# Patient Record
Sex: Female | Born: 1950 | Hispanic: Yes | Marital: Married
Health system: Southern US, Community
[De-identification: ages and names within clinical notes are randomized; demographics above are authoritative.]

## PROBLEM LIST (undated history)

## (undated) DIAGNOSIS — Z9013 Acquired absence of bilateral breasts and nipples: Secondary | ICD-10-CM

## (undated) DIAGNOSIS — F40240 Claustrophobia: Secondary | ICD-10-CM

## (undated) DIAGNOSIS — C50911 Malignant neoplasm of unspecified site of right female breast: Secondary | ICD-10-CM

## (undated) DIAGNOSIS — M1990 Unspecified osteoarthritis, unspecified site: Secondary | ICD-10-CM

## (undated) DIAGNOSIS — F419 Anxiety disorder, unspecified: Secondary | ICD-10-CM

## (undated) DIAGNOSIS — I10 Essential (primary) hypertension: Secondary | ICD-10-CM

## (undated) HISTORY — DX: Essential (primary) hypertension: I10

## (undated) HISTORY — PX: AUGMENTATION MAMMAPLASTY: SUR837

## (undated) HISTORY — DX: Anxiety disorder, unspecified: F41.9

## (undated) HISTORY — DX: Unspecified osteoarthritis, unspecified site: M19.90

---

## 2002-09-27 HISTORY — PX: COLONOSCOPY: SHX174

## 2012-09-27 DIAGNOSIS — C50911 Malignant neoplasm of unspecified site of right female breast: Secondary | ICD-10-CM

## 2012-09-27 HISTORY — DX: Malignant neoplasm of unspecified site of right female breast: C50.911

## 2012-09-27 HISTORY — PX: BILATERAL TOTAL MASTECTOMY WITH AXILLARY LYMPH NODE DISSECTION: SHX6364

## 2012-09-27 HISTORY — PX: MASTECTOMY: SHX3

## 2013-04-11 DIAGNOSIS — C50919 Malignant neoplasm of unspecified site of unspecified female breast: Secondary | ICD-10-CM | POA: Insufficient documentation

## 2015-11-19 ENCOUNTER — Encounter: Payer: Self-pay | Admitting: Hematology and Oncology

## 2015-11-19 ENCOUNTER — Inpatient Hospital Stay: Payer: BLUE CROSS/BLUE SHIELD | Attending: Hematology and Oncology | Admitting: Hematology and Oncology

## 2015-11-19 ENCOUNTER — Inpatient Hospital Stay: Payer: BLUE CROSS/BLUE SHIELD

## 2015-11-19 VITALS — BP 131/86 | HR 72 | Temp 97.8°F | Resp 20 | Wt 235.9 lb

## 2015-11-19 DIAGNOSIS — I10 Essential (primary) hypertension: Secondary | ICD-10-CM | POA: Insufficient documentation

## 2015-11-19 DIAGNOSIS — R978 Other abnormal tumor markers: Secondary | ICD-10-CM | POA: Diagnosis not present

## 2015-11-19 DIAGNOSIS — Z170 Estrogen receptor positive status [ER+]: Secondary | ICD-10-CM | POA: Diagnosis not present

## 2015-11-19 DIAGNOSIS — Z79899 Other long term (current) drug therapy: Secondary | ICD-10-CM | POA: Diagnosis not present

## 2015-11-19 DIAGNOSIS — C50911 Malignant neoplasm of unspecified site of right female breast: Secondary | ICD-10-CM

## 2015-11-19 DIAGNOSIS — Z9013 Acquired absence of bilateral breasts and nipples: Secondary | ICD-10-CM | POA: Diagnosis not present

## 2015-11-19 DIAGNOSIS — R918 Other nonspecific abnormal finding of lung field: Secondary | ICD-10-CM

## 2015-11-19 DIAGNOSIS — M1990 Unspecified osteoarthritis, unspecified site: Secondary | ICD-10-CM | POA: Diagnosis not present

## 2015-11-19 DIAGNOSIS — Z79811 Long term (current) use of aromatase inhibitors: Secondary | ICD-10-CM | POA: Diagnosis not present

## 2015-11-19 LAB — COMPREHENSIVE METABOLIC PANEL
ALT: 21 U/L (ref 14–54)
AST: 24 U/L (ref 15–41)
Albumin: 4.2 g/dL (ref 3.5–5.0)
Alkaline Phosphatase: 121 U/L (ref 38–126)
Anion gap: 4 — ABNORMAL LOW (ref 5–15)
BUN: 19 mg/dL (ref 6–20)
CO2: 25 mmol/L (ref 22–32)
Calcium: 9.3 mg/dL (ref 8.9–10.3)
Chloride: 105 mmol/L (ref 101–111)
Creatinine, Ser: 0.75 mg/dL (ref 0.44–1.00)
GFR calc Af Amer: >60 mL/min (ref >60)
GFR calc non Af Amer: >60 mL/min (ref >60)
Glucose, Bld: 100 mg/dL — ABNORMAL HIGH (ref 65–99)
Potassium: 4 mmol/L (ref 3.5–5.1)
Sodium: 134 mmol/L — ABNORMAL LOW (ref 135–145)
Total Bilirubin: 0.6 mg/dL (ref 0.3–1.2)
Total Protein: 7.4 g/dL (ref 6.5–8.1)

## 2015-11-19 LAB — CBC WITH DIFFERENTIAL/PLATELET
Basophils Absolute: 0.1 10*3/uL (ref 0–0.1)
Basophils Relative: 1 %
Eosinophils Absolute: 0.3 10*3/uL (ref 0–0.7)
Eosinophils Relative: 3 %
HCT: 43.5 % (ref 35.0–47.0)
Hemoglobin: 14.7 g/dL (ref 12.0–16.0)
Lymphocytes Relative: 34 %
Lymphs Abs: 3.1 10*3/uL (ref 1.0–3.6)
MCH: 30.7 pg (ref 26.0–34.0)
MCHC: 33.7 g/dL (ref 32.0–36.0)
MCV: 91 fL (ref 80.0–100.0)
Monocytes Absolute: 0.6 10*3/uL (ref 0.2–0.9)
Monocytes Relative: 7 %
Neutro Abs: 5 10*3/uL (ref 1.4–6.5)
Neutrophils Relative %: 55 %
Platelets: 242 10*3/uL (ref 150–440)
RBC: 4.78 MIL/uL (ref 3.80–5.20)
RDW: 13.5 % (ref 11.5–14.5)
WBC: 9.1 10*3/uL (ref 3.6–11.0)

## 2015-11-19 MED ORDER — EXEMESTANE 25 MG PO TABS: 25.0000 mg | ORAL_TABLET | Freq: Every day | ORAL | Status: DC

## 2015-11-19 NOTE — Progress Notes (Signed)
[facility] day:  11/19/2015  Chief Complaint: [place] is a 65 y.o. female with stage IA right breast cancer who is referred by Dr Kathlene November for new patient assessment.  HPI:   The patient was diagnosed with breast cancer in 01/2013 while living in [place], [place].  She presented with an abnormal mammogram.  Stereotactic core biopsy on 02/06/2013 revealed at least 5 mm invasive ductal carcinoma, basaloid subtype.  Tumor was ER positive (1+), PR positive (1+), and Her2/neu negative.  She underwent bilateral nipple sparing mastectomies and right axillary sentinel lymph node biopsy on 04/11/2013.  Pathology revealed no invasive carcinoma in the right breast.  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  Margins were negative.  Left breast pathology revealed 2 mm DCIS without invasive carcinoma.  Two right sided sentinel lymph nodes were negative.  She underwent immediate reconstruction with expanders followed by additional reconstructive surgery in 09/03/2013 and 12/2013.  She initially received Arimidex following recovery from surgery.  She describes "a lot of joint pain".  After approximately 1 year on Arimidex, she was switched to Aromasin.  She states that she took this without problems for approximately 6 months.  Her last oncologist, Dr. Leory Plowman, stopped the Aromasin given the small size and 1+ hormone receptor positivity on 05/08/2015.  Bone density study on 10/08/2013 was normal with a T score of 1.4 in the lumbar spine, -0.1 in the left hip, and 0.1 in the right hip.  She has a history of pulmonary nodules. Chest CT scans on 10/27/2013 and 08/24/2014 noted scattered sub-62m nodules.  PET scan in 10/29/2014 revealed no evidence of metastatic disease.  The scattered sub-542mnodules were below the resolution limits of the PET scan.  She has had a mildly elevated CA27.29.  CA27.29 was 56 on 12/30/2014.  CA15-3 was 30  (normal) on 12/30/2014.  She notes that her mother had breast cancer in her 3060s The patient underwent BRCA1/2 testing on 02/16/2014 which was negative.  Symptomatically, she feels good.  She has mild vasomotor symptoms.  She denies any breast concerns.  She has issues with a herniated disk.  Past Medical History  Diagnosis Date  . Breast cancer (HCG[place]ison  . Hypertension   . Arthritis   . Anxiety     Past Surgical History  Procedure Laterality Date  . Bilateral total mastectomy with axillary lymph node dissection  2014  . Colonoscopy  2004    Family History  Problem Relation Age of Onset  . Cancer Mother     breast cancer; lung cancer    Social History:  reports that she does not drink alcohol or use illicit drugs. Her tobacco history is not on file.  [place] The patient is alone today.  Allergies: Allergies not on file  Current Medications: Current Outpatient Prescriptions  Medication Sig Dispense Refill  . cyclobenzaprine (FLEXERIL) 10 MG tablet Take 10 mg by mouth 3 (three) times daily as needed for muscle spasms.    . metoprolol succinate (TOPROL-XL) 25 MG 24 hr tablet Take 25 mg by mouth daily.    . Marland Kitchenolpidem (AMBIEN) 10 MG tablet Take 10 mg by mouth at bedtime.    . Marland Kitchenxemestane (AROMASIN) 25 MG tablet Take 1 tablet (25 mg total) by mouth daily after breakfast. 30 tablet 5   No current facility-administered medications for  this visit.    Review of Systems:  GENERAL:  Feels good.  Active.  No fevers, sweats or weight loss. PERFORMANCE STATUS (ECOG):  1 HEENT:  No visual changes, runny nose, sore throat, mouth sores or tenderness. Lungs: No shortness of breath or cough.  No hemoptysis. Cardiac:  No chest pain, palpitations, orthopnea, or PND. GI:  No nausea, vomiting, diarrhea, constipation, melena or hematochezia.  Colonoscopy in 2004. GU:  No urgency, frequency, dysuria, or  hematuria. Musculoskeletal:  Herniated disk.  No joint pain.  No muscle tenderness. Extremities:  Right foot cold and numb (old).  Arthritis in knees.  No swelling. Skin:  No rashes or skin changes. Neuro:  No headache, numbness or weakness, balance or coordination issues. Endocrine:  Hot flashes.  No diabetes, thyroid issues, or night sweats. Psych:  Anxiety.  Panic attacks.  No mood changes or depression. Pain:  Chronic back pain secondary to herniated disk. Review of systems:  All other systems reviewed and found to be negative.  Physical Exam: Blood pressure 131/86, pulse 72, temperature 97.8 F (36.6 C), temperature source Oral, resp. rate 20, weight 235 lb 14.3 oz (107 kg), SpO2 96 %. GENERAL:  Well developed, well nourished, sitting comfortably in the exam room in no acute distress. MENTAL STATUS:  Alert and oriented to person, place and time. HEAD:  Short brown hair.  Normocephalic, atraumatic, face symmetric, no Cushingoid features. EYES:  Brown eyes.  Pupils equal round and reactive to light and accomodation.  No conjunctivitis or scleral icterus. ENT:  Oropharynx clear without lesion.  Tongue normal. Mucous membranes moist.  RESPIRATORY:  Clear to auscultation without rales, wheezes or rhonchi. CARDIOVASCULAR:  Regular rate and rhythm without murmur, rub or gallop. BREAST:  s/p bilateral mastectomies with implants and reconstruction.  No masses, skin changes or nodularity.  ABDOMEN:  Soft, non-tender, with active bowel sounds, and no hepatosplenomegaly.  No masses. SKIN:  No rashes, ulcers or lesions. EXTREMITIES: No edema, no skin discoloration or tenderness.  No palpable cords. LYMPH NODES: No palpable cervical, supraclavicular, axillary or inguinal adenopathy  NEUROLOGICAL: Unremarkable. PSYCH:  Appropriate.  Appointment on 11/19/2015  Component Date Value Ref Range Status  . WBC 11/19/2015 9.1  3.6 - 11.0 K/uL Final  . RBC 11/19/2015 4.78  3.80 - 5.20 MIL/uL Final  .  Hemoglobin 11/19/2015 14.7  12.0 - 16.0 g/dL Final  . HCT 11/19/2015 43.5  35.0 - 47.0 % Final  . MCV 11/19/2015 91.0  80.0 - 100.0 fL Final  . MCH 11/19/2015 30.7  26.0 - 34.0 pg Final  . MCHC 11/19/2015 33.7  32.0 - 36.0 g/dL Final  . RDW 11/19/2015 13.5  11.5 - 14.5 % Final  . Platelets 11/19/2015 242  150 - 440 K/uL Final  . Neutrophils Relative % 11/19/2015 55   Final  . Neutro Abs 11/19/2015 5.0  1.4 - 6.5 K/uL Final  . Lymphocytes Relative 11/19/2015 34   Final  . Lymphs Abs 11/19/2015 3.1  1.0 - 3.6 K/uL Final  . Monocytes Relative 11/19/2015 7   Final  . Monocytes Absolute 11/19/2015 0.6  0.2 - 0.9 K/uL Final  . Eosinophils Relative 11/19/2015 3   Final  . Eosinophils Absolute 11/19/2015 0.3  0 - 0.7 K/uL Final  . Basophils Relative 11/19/2015 1   Final  . Basophils Absolute 11/19/2015 0.1  0 - 0.1 K/uL Final  . Sodium 11/19/2015 134* 135 - 145 mmol/L Final  . Potassium 11/19/2015 4.0  3.5 - 5.1 mmol/L Final  .  Chloride 11/19/2015 105  101 - 111 mmol/L Final  . CO2 11/19/2015 25  22 - 32 mmol/L Final  . Glucose, Bld 11/19/2015 100* 65 - 99 mg/dL Final  . BUN 11/19/2015 19  6 - 20 mg/dL Final  . Creatinine, Ser 11/19/2015 0.75  0.44 - 1.00 mg/dL Final  . Calcium 11/19/2015 9.3  8.9 - 10.3 mg/dL Final  . Total Protein 11/19/2015 7.4  6.5 - 8.1 g/dL Final  . Albumin 11/19/2015 4.2  3.5 - 5.0 g/dL Final  . AST 11/19/2015 24  15 - 41 U/L Final  . ALT 11/19/2015 21  14 - 54 U/L Final  . Alkaline Phosphatase 11/19/2015 121  38 - 126 U/L Final  . Total Bilirubin 11/19/2015 0.6  0.3 - 1.2 mg/dL Final  . GFR calc non Af Amer 11/19/2015 >60  >60 mL/min Final  . GFR calc Af Amer 11/19/2015 >60  >60 mL/min Final   Comment: (NOTE) The eGFR has been calculated using the CKD EPI equation. This calculation has not been validated in all clinical situations. eGFR's persistently <60 mL/min signify possible Chronic Kidney Disease.   . Anion gap 11/19/2015 4* 5 - 15 Final  . CA 27.29  11/19/2015 64.3* 0.0 - 38.6 U/mL Final   Comment: (NOTE) [employer]/ACS methodology Performed At: [facility] [address] [place], [place] [id number] Lindon Romp MD IE:[id number]     Assessment:  [place] is a 65 y.o. female stage IA right breast cancer status post bilateral nipple sparing mastectomies and right axillary sentinel lymph node biopsy on 04/11/2013.    Initial biopsy on 02/06/2013 revealed at least 5 mm invasive ductal carcinoma, basaloid subtype.  Pathology post mastectomy revealed no invasive carcinoma in the right breast.  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  Margins were negative.  Two right sided sentinel lymph nodes were negative.  Left breast pathology revealed 2 mm DCIS without invasive carcinoma.  Tumor was ER positive (1+), PR positive (1+), and Her2/neu negative.  She underwent immediate reconstruction with expanders followed by additional reconstructive surgery in 09/03/2013 and 12/2013.  Her mother had breast cancer in her 33s.  BRCA1/2 testing was negative on 02/16/2014.  She initially received Arimidex following recovery from surgery.  She describes "a lot of joint pain".  After approximately 1 year on Arimidex, she was switched to Aromasin for approximately 6 months.  Aromasin was discontinued on 05/08/2015 given the small size and 1+ hormone receptor positivity.  She denies any symptoms while on Aromasin.  CA27.29 was 56 (elevated) on 12/30/2014.  CA15-3 was 30 (normal) on 12/30/2014.  Bone density study on 10/08/2013 was normal with a T score of 1.4 in the lumbar spine, -0.1 in the left hip, and 0.1 in the right hip.  She has a history of pulmonary nodules. Chest CT scans on 10/27/2013 and 08/24/2014 noted scattered sub-69m nodules.  PET scan in 10/29/2014 revealed no evidence of metastatic disease.  The sub-587mnodules were below the resolution limits of the PET scan.  Symptomatically, she notes mild vasomotor  symptoms.  She has issues with a herniated disk.  Exam is unremarkable.  Plan: 1. Review entire medical history, diagnosis and management of breast cancer, and plan for follow-up.  Discuss plan for PET scan if tumor marker elevated given history of pulmonary nodules.  Discuss reinstitution of aromatase inhibitor (Aromasin).  Discuss plan for bone density study every 2 years. 2. Discuss need for colonoscopy every 10 years (late).  3. Labs today:  CBC with diff, CMP, CA27.29. 4. Rx:  Aromasin 25 mg po q day.  Dis: #30 with 5 refills. 5. RTC in 3 months for labs (CMP). 6. RTC in 6 months for MD assess, labs (CBC with diff, CMP, CA27.29).  Addendum:  Patient notified of elevated CA27.29.  Will schedule follow-up PET scan.  Review of records noted bone density study in 2015 rather than 2016.  Bone density study will be ordered.   Lequita Asal, MD  11/19/2015, 2:36 PM

## 2015-11-19 NOTE — Progress Notes (Signed)
Pt moved from [facility]. Here for continuation of care for R Breast Cancer. Pt had double mastectomy  in 2014. She was treated with AI's. Stopped taking AI d/t severe joint discomfort. Pt feels well. Hx chronic back pain  d/t herniate disc. Hx of panic attacks/anxiety. Pt is to restart aromasin. Prescription was faxed to [facility] in [place].

## 2015-11-20 ENCOUNTER — Other Ambulatory Visit: Payer: Self-pay | Admitting: Hematology and Oncology

## 2015-11-20 ENCOUNTER — Telehealth: Payer: Self-pay

## 2015-11-20 ENCOUNTER — Telehealth: Payer: Self-pay | Admitting: Hematology and Oncology

## 2015-11-20 DIAGNOSIS — C50911 Malignant neoplasm of unspecified site of right female breast: Secondary | ICD-10-CM

## 2015-11-20 LAB — CANCER ANTIGEN 27.29: CA 27.29: 64.3 U/mL — ABNORMAL HIGH (ref 0.0–38.6)

## 2015-11-20 NOTE — Telephone Encounter (Signed)
Patient got a call informing her that Dr. Loletha Grayer wants a PET scan and she just wanted to let you know that she is claustrophobic and that in [place] her MD would prescribe Xanax for any procedures that would have her in an enclosed space. She also said she can't do it between April 1-4 because she will be out of town.

## 2015-11-20 NOTE — Telephone Encounter (Signed)
Called pt per MD request to notify of a elevated Ca 27.29.  Md wanted to know if pt would like a PET and pt agreed.  Informed pt that I will have scheduling call and set up appt. Pt verbalized an understanding and in agreement with plan.

## 2015-11-23 ENCOUNTER — Encounter: Payer: Self-pay | Admitting: Hematology and Oncology

## 2015-11-27 ENCOUNTER — Telehealth: Payer: Self-pay | Admitting: Hematology and Oncology

## 2015-11-27 ENCOUNTER — Other Ambulatory Visit: Payer: Self-pay | Admitting: Hematology and Oncology

## 2015-11-27 NOTE — Telephone Encounter (Signed)
   Ref Range 8d ago    CA 27.29 0.0 - 38.6 U/mL 64.3 (H)

## 2015-11-27 NOTE — Telephone Encounter (Signed)
Patient called to inquire about PET. Scheduling notes from last visit do not indicate one should be scheduled though an order is in. Notes indicate PET will be scheduled depending on tumor marker elevation. Please advise [place] if this should be scheduled or not. Thanks.

## 2015-11-27 NOTE — Telephone Encounter (Signed)
Patient is aware of her PET scheduled for Tuesday, March 7 at [facility] and wants to make sure Dr. Mike Gip will be sending a prescription for her Xanax due to her claustrophobia. Thanks.

## 2015-11-27 NOTE — Telephone Encounter (Signed)
  Order for PET scan has been in.  Is she scheduled for the scan?  M

## 2015-11-27 NOTE — Telephone Encounter (Signed)
[place],   This was a separate in box sent it was added on because of a elevated Ca. 27.29.  She needs a PET scheduled.  If im not mistaken order was placed by Dr. Loletha Grayer and I will check to be sure   Thank you.  [name]

## 2015-12-01 ENCOUNTER — Telehealth: Payer: Self-pay | Admitting: Hematology and Oncology

## 2015-12-01 ENCOUNTER — Other Ambulatory Visit: Payer: Self-pay | Admitting: Hematology and Oncology

## 2015-12-01 DIAGNOSIS — C50911 Malignant neoplasm of unspecified site of right female breast: Secondary | ICD-10-CM

## 2015-12-01 MED ORDER — ALPRAZOLAM 0.25 MG PO TABS: ORAL_TABLET | ORAL | Status: DC

## 2015-12-01 NOTE — Telephone Encounter (Signed)
Patient checked with her pharmacy and said the Xanax Rx is still not in. She said she won't be able to do the PET without it and won't even go to the appointment tomorrow for the PET without the Rx because she had such a negative experience with her claustrophobia the last time she had one done. Please send in Rx asap to her pharmacy. Thank you.

## 2015-12-01 NOTE — Telephone Encounter (Signed)
Xanax 0.25 mg #1 called in. Pt notified

## 2015-12-02 ENCOUNTER — Ambulatory Visit: Admission: RE | Admit: 2015-12-02 | Payer: BLUE CROSS/BLUE SHIELD | Source: Ambulatory Visit

## 2015-12-16 ENCOUNTER — Telehealth: Payer: Self-pay | Admitting: Hematology and Oncology

## 2015-12-16 NOTE — Telephone Encounter (Signed)
She is panicked about her CT and bone scan on 3/24 because she learned that they will "wrap" her arm or strap her arm to table for bone scan. She wants to make sure she will have Xanax for both procedures or she will be too nervous and will not even be able to lay on the table. She is extremely claustrobphobic. She is afraid that the medicine will wear off from the first one before the second test. Please prescribe and let patient know asap when Rx has been called in. Routing this to [place] since she is on call today and Mike Gip is out all week.

## 2015-12-18 ENCOUNTER — Telehealth: Payer: Self-pay | Admitting: Hematology and Oncology

## 2015-12-18 NOTE — Telephone Encounter (Signed)
Patient called again today to find out if her Rx for claustrophobia for procedures tomorrow has been called in. Should be two doses for both exams. Can you please confirm? Thanks.

## 2015-12-19 ENCOUNTER — Ambulatory Visit: Payer: BLUE CROSS/BLUE SHIELD

## 2015-12-19 ENCOUNTER — Encounter
Admission: RE | Admit: 2015-12-19 | Discharge: 2015-12-19 | Disposition: A | Discharge status: Home / Self Care | Payer: BLUE CROSS/BLUE SHIELD | Source: Ambulatory Visit | Attending: Hematology and Oncology | Admitting: Hematology and Oncology

## 2015-12-19 ENCOUNTER — Ambulatory Visit
Admission: RE | Admit: 2015-12-19 | Discharge: 2015-12-19 | Disposition: A | Discharge status: Home / Self Care | Payer: BLUE CROSS/BLUE SHIELD | Source: Ambulatory Visit | Attending: Hematology and Oncology | Admitting: Hematology and Oncology

## 2015-12-19 DIAGNOSIS — K5730 Diverticulosis of large intestine without perforation or abscess without bleeding: Secondary | ICD-10-CM | POA: Insufficient documentation

## 2015-12-19 DIAGNOSIS — Z9013 Acquired absence of bilateral breasts and nipples: Secondary | ICD-10-CM | POA: Insufficient documentation

## 2015-12-19 DIAGNOSIS — K760 Fatty (change of) liver, not elsewhere classified: Secondary | ICD-10-CM | POA: Diagnosis not present

## 2015-12-19 DIAGNOSIS — C50911 Malignant neoplasm of unspecified site of right female breast: Secondary | ICD-10-CM

## 2015-12-19 DIAGNOSIS — K429 Umbilical hernia without obstruction or gangrene: Secondary | ICD-10-CM | POA: Diagnosis not present

## 2015-12-19 DIAGNOSIS — R918 Other nonspecific abnormal finding of lung field: Secondary | ICD-10-CM | POA: Insufficient documentation

## 2015-12-19 HISTORY — DX: Malignant neoplasm of unspecified site of right female breast: C50.911

## 2015-12-19 MED ORDER — IOPAMIDOL (ISOVUE-300) INJECTION 61%
100.0000 mL | Freq: Once | INTRAVENOUS | Status: AC | PRN
  Administered 2015-12-19: 100 mL via INTRAVENOUS

## 2015-12-19 MED ORDER — TECHNETIUM TC 99M MEDRONATE IV KIT
25.0000 | PACK | Freq: Once | INTRAVENOUS | Status: AC | PRN
  Administered 2015-12-19: 22.64 via INTRAVENOUS

## 2015-12-19 NOTE — Telephone Encounter (Signed)
Mickel Baas,   Per NP Georgeanne Nim she stated she had spoke with you about this on 3/23 in [place] regarding the medication (Xanax)  for bone scan and she was not in agreement for prescribing the medication at this time for a bone scan.

## 2015-12-19 NOTE — Telephone Encounter (Signed)
Not it was not made clear to me that [place] did not agree about the Rx for bone scan. I recall her saying that [name] would be handling it from [place] but I was not aware that she disagreed about prescribing the second dose.

## 2015-12-22 ENCOUNTER — Other Ambulatory Visit: Payer: Self-pay | Admitting: Hematology and Oncology

## 2015-12-22 DIAGNOSIS — R948 Abnormal results of function studies of other organs and systems: Secondary | ICD-10-CM

## 2015-12-22 DIAGNOSIS — R937 Abnormal findings on diagnostic imaging of other parts of musculoskeletal system: Secondary | ICD-10-CM

## 2015-12-24 ENCOUNTER — Telehealth: Payer: Self-pay | Admitting: Hematology and Oncology

## 2015-12-24 NOTE — Telephone Encounter (Signed)
Called pt and went over bone scan.  Told her that it did not show metastatic disease but did show something in C3-4 in the neck and rec: that she get cervical xray.  The ct scan showed some small pulmonary nodules so tiny they were unable to determine the significance and rec: another scan in 3 months.  I told her that I would show the reports to md in am and get back with her on next plan from there and she is agreeable to this

## 2015-12-24 NOTE — Telephone Encounter (Signed)
Patient asks if you would please call her when you get the results of her scans. Thank you!

## 2016-01-01 ENCOUNTER — Ambulatory Visit: Payer: BLUE CROSS/BLUE SHIELD

## 2016-01-05 ENCOUNTER — Ambulatory Visit: Payer: BLUE CROSS/BLUE SHIELD

## 2016-01-08 ENCOUNTER — Ambulatory Visit: Payer: BLUE CROSS/BLUE SHIELD

## 2016-01-28 ENCOUNTER — Other Ambulatory Visit: Payer: Self-pay | Admitting: Internal Medicine

## 2016-02-10 ENCOUNTER — Ambulatory Visit
Admission: RE | Admit: 2016-02-10 | Discharge: 2016-02-10 | Disposition: A | Discharge status: Home / Self Care | Payer: BLUE CROSS/BLUE SHIELD | Source: Ambulatory Visit | Attending: Hematology and Oncology | Admitting: Hematology and Oncology

## 2016-02-10 DIAGNOSIS — Z79899 Other long term (current) drug therapy: Secondary | ICD-10-CM | POA: Insufficient documentation

## 2016-02-10 DIAGNOSIS — C50911 Malignant neoplasm of unspecified site of right female breast: Secondary | ICD-10-CM | POA: Insufficient documentation

## 2016-02-10 DIAGNOSIS — M8580 Other specified disorders of bone density and structure, unspecified site: Secondary | ICD-10-CM | POA: Insufficient documentation

## 2016-02-10 DIAGNOSIS — M85851 Other specified disorders of bone density and structure, right thigh: Secondary | ICD-10-CM | POA: Diagnosis not present

## 2016-02-18 ENCOUNTER — Inpatient Hospital Stay: Payer: BLUE CROSS/BLUE SHIELD

## 2016-02-19 ENCOUNTER — Other Ambulatory Visit: Payer: BLUE CROSS/BLUE SHIELD

## 2016-02-26 ENCOUNTER — Other Ambulatory Visit: Payer: Self-pay | Admitting: Hematology and Oncology

## 2016-02-27 ENCOUNTER — Inpatient Hospital Stay: Payer: BLUE CROSS/BLUE SHIELD

## 2016-03-03 ENCOUNTER — Encounter: Payer: Self-pay | Admitting: *Deleted

## 2016-03-10 ENCOUNTER — Telehealth: Payer: Self-pay | Admitting: *Deleted

## 2016-03-10 NOTE — Telephone Encounter (Signed)
-----   Message from Lequita Asal, MD sent at 03/09/2016  4:30 PM EDT ----- Regarding: Please call patient  Lung nodules are stable and were compared with nodules noted on outside CT scan from 2015.  M   ----- Message -----    From: Rad Results In Interface    Sent: 03/09/2016   3:52 PM      To: Lequita Asal, MD

## 2016-03-10 NOTE — Telephone Encounter (Signed)
Called and left message that dr Mike Gip wanted to be on safe side and compare her old scans from [facility] with scans we have and it is stable lung nodules not changed since 2015 scan. It was good news to chare with pt.  If she has questions she can call.

## 2016-03-18 ENCOUNTER — Inpatient Hospital Stay: Payer: BLUE CROSS/BLUE SHIELD | Attending: Hematology and Oncology

## 2016-03-18 DIAGNOSIS — Z170 Estrogen receptor positive status [ER+]: Secondary | ICD-10-CM | POA: Diagnosis not present

## 2016-03-18 DIAGNOSIS — C50911 Malignant neoplasm of unspecified site of right female breast: Secondary | ICD-10-CM | POA: Diagnosis not present

## 2016-03-18 DIAGNOSIS — Z9011 Acquired absence of right breast and nipple: Secondary | ICD-10-CM | POA: Insufficient documentation

## 2016-03-18 LAB — COMPREHENSIVE METABOLIC PANEL
ALT: 27 U/L (ref 14–54)
AST: 24 U/L (ref 15–41)
Albumin: 4.2 g/dL (ref 3.5–5.0)
Alkaline Phosphatase: 111 U/L (ref 38–126)
Anion gap: 8 (ref 5–15)
BUN: 19 mg/dL (ref 6–20)
CO2: 26 mmol/L (ref 22–32)
Calcium: 9.4 mg/dL (ref 8.9–10.3)
Chloride: 104 mmol/L (ref 101–111)
Creatinine, Ser: 0.85 mg/dL (ref 0.44–1.00)
GFR calc Af Amer: >60 mL/min (ref >60)
GFR calc non Af Amer: >60 mL/min (ref >60)
Glucose, Bld: 99 mg/dL (ref 65–99)
Potassium: 4.1 mmol/L (ref 3.5–5.1)
Sodium: 138 mmol/L (ref 135–145)
Total Bilirubin: 0.7 mg/dL (ref 0.3–1.2)
Total Protein: 7.5 g/dL (ref 6.5–8.1)

## 2016-05-19 ENCOUNTER — Inpatient Hospital Stay: Payer: BLUE CROSS/BLUE SHIELD | Attending: Hematology and Oncology

## 2016-05-19 ENCOUNTER — Encounter: Payer: Self-pay | Admitting: Hematology and Oncology

## 2016-05-19 ENCOUNTER — Inpatient Hospital Stay (HOSPITAL_BASED_OUTPATIENT_CLINIC_OR_DEPARTMENT_OTHER): Payer: BLUE CROSS/BLUE SHIELD | Admitting: Hematology and Oncology

## 2016-05-19 VITALS — BP 114/74 | HR 73 | Temp 98.6°F | Resp 18 | Wt 233.4 lb

## 2016-05-19 DIAGNOSIS — Z79899 Other long term (current) drug therapy: Secondary | ICD-10-CM | POA: Insufficient documentation

## 2016-05-19 DIAGNOSIS — R918 Other nonspecific abnormal finding of lung field: Secondary | ICD-10-CM

## 2016-05-19 DIAGNOSIS — R0602 Shortness of breath: Secondary | ICD-10-CM

## 2016-05-19 DIAGNOSIS — F419 Anxiety disorder, unspecified: Secondary | ICD-10-CM | POA: Insufficient documentation

## 2016-05-19 DIAGNOSIS — Z170 Estrogen receptor positive status [ER+]: Secondary | ICD-10-CM

## 2016-05-19 DIAGNOSIS — I10 Essential (primary) hypertension: Secondary | ICD-10-CM | POA: Diagnosis not present

## 2016-05-19 DIAGNOSIS — C50911 Malignant neoplasm of unspecified site of right female breast: Secondary | ICD-10-CM

## 2016-05-19 DIAGNOSIS — M1990 Unspecified osteoarthritis, unspecified site: Secondary | ICD-10-CM | POA: Insufficient documentation

## 2016-05-19 DIAGNOSIS — Z9013 Acquired absence of bilateral breasts and nipples: Secondary | ICD-10-CM

## 2016-05-19 DIAGNOSIS — R948 Abnormal results of function studies of other organs and systems: Secondary | ICD-10-CM

## 2016-05-19 DIAGNOSIS — R937 Abnormal findings on diagnostic imaging of other parts of musculoskeletal system: Secondary | ICD-10-CM

## 2016-05-19 DIAGNOSIS — M8580 Other specified disorders of bone density and structure, unspecified site: Secondary | ICD-10-CM | POA: Insufficient documentation

## 2016-05-19 LAB — CBC WITH DIFFERENTIAL/PLATELET
Basophils Absolute: 0.1 10*3/uL (ref 0–0.1)
Basophils Relative: 1 %
Eosinophils Absolute: 0.4 10*3/uL (ref 0–0.7)
Eosinophils Relative: 4 %
HCT: 42.4 % (ref 35.0–47.0)
Hemoglobin: 14.4 g/dL (ref 12.0–16.0)
Lymphocytes Relative: 37 %
Lymphs Abs: 3.5 10*3/uL (ref 1.0–3.6)
MCH: 31.1 pg (ref 26.0–34.0)
MCHC: 33.8 g/dL (ref 32.0–36.0)
MCV: 92.1 fL (ref 80.0–100.0)
Monocytes Absolute: 0.7 10*3/uL (ref 0.2–0.9)
Monocytes Relative: 7 %
Neutro Abs: 4.8 10*3/uL (ref 1.4–6.5)
Neutrophils Relative %: 51 %
Platelets: 254 10*3/uL (ref 150–440)
RBC: 4.61 MIL/uL (ref 3.80–5.20)
RDW: 13.4 % (ref 11.5–14.5)
WBC: 9.5 10*3/uL (ref 3.6–11.0)

## 2016-05-19 LAB — COMPREHENSIVE METABOLIC PANEL
ALT: 24 U/L (ref 14–54)
AST: 24 U/L (ref 15–41)
Albumin: 4.4 g/dL (ref 3.5–5.0)
Alkaline Phosphatase: 92 U/L (ref 38–126)
Anion gap: 7 (ref 5–15)
BUN: 21 mg/dL — ABNORMAL HIGH (ref 6–20)
CO2: 27 mmol/L (ref 22–32)
Calcium: 9.6 mg/dL (ref 8.9–10.3)
Chloride: 105 mmol/L (ref 101–111)
Creatinine, Ser: 0.86 mg/dL (ref 0.44–1.00)
GFR calc Af Amer: >60 mL/min (ref >60)
GFR calc non Af Amer: >60 mL/min (ref >60)
Glucose, Bld: 102 mg/dL — ABNORMAL HIGH (ref 65–99)
Potassium: 4.5 mmol/L (ref 3.5–5.1)
Sodium: 139 mmol/L (ref 135–145)
Total Bilirubin: 0.7 mg/dL (ref 0.3–1.2)
Total Protein: 7.7 g/dL (ref 6.5–8.1)

## 2016-05-19 MED ORDER — EXEMESTANE 25 MG PO TABS: 25.0000 mg | ORAL_TABLET | Freq: Every day | ORAL | 3 refills | Status: DC

## 2016-05-19 NOTE — Progress Notes (Signed)
[facility] day:  05/19/16   Chief Complaint: Brittany Ramos is a 65 y.o. female with stage IA right breast cancer who is seen for review of multiple studies and 6 month assessment.  HPI:   The patient was last seen in the medical oncology clinic on 11/19/2015.  At that time, she was seen for initial assessment.  She had undergone bilateral mastectomy in 03/2013 while living in [place].  She has only received 1 year of Arimidex and 6 months of Aromasin secondary to arthalgias, small tumor size, and 1+ hormone receptor positivity per her prior oncologist. She had a history of an elevated CA27.29 in 12/2014.   At last visit, she noted mild vasomotor symptoms.  She had issues with a herniated disk.  Exam was unremarkable.  She was given a prescription from Aromasin.  CA27.29 was 64.3 (0-38.6).  She was contacted regarding her elevated tumor marker.  A PET scan was ordered, but denied.  She underwent chest, abdomen, and pelvic CT scan on 12/19/2015.  CT scan revealed 7 indeterminate scattered pulmonary nodules in both lungs, largest 7 mm in the right upper lobe  There were no additional potential sites of metastatic disease in the chest, abdomen or pelvis. There was no lymphadenopathy or osseous lesions.  There was patchy basilar predominant subpleural reticulation throughout both lungs, which could indicate an interstitial lung disease such as nonspecific interstitial pneumonia (NSIP).  Additional findings included moderate fat containing periumbilical hernia, suggestion of diffuse hepatic steatosis and mild sigmoid diverticulosis.  Following her imaging, outside chest CT scan from 08/24/2014 was reviewed by radiology.  Each of the 7 pulmonary nodules described on the original 12/19/2015 CT report were stable since 08/24/2014, indicating 15 month stability. These nodules are considered benign.   Bone scan on 12/19/2015 revealed no abnormal uptake in the lumbar  spine.  There was mildly increased uptake in the posterior elements of approximately C3 or C4 on the right and in the body of C7 or T1. A cervical spine series was recommended.  There was increased uptake associated with the left sternoclavicular joint, both knees, and the right foot is compatible with degenerative change.  Bone density study on 02/10/2016 revealed osteopenia with a T score of -1.5 in the right femoral neck.  Symptomatically, she feels good.  She notes a little shortness of breath.  She has had some wheezing.  She denies any pleuritic chest pain.  She denies any lower extremity edema.  She denies any breast concerns.  She has some back issues due to a "pinched nerve" for which is is following up Dr. Dolores Frame.   Past Medical History:  Diagnosis Date  . Anxiety   . Arthritis   . Breast cancer, right ([place]) 2014   Bilateral Mastectomies, chemo pill.   . Hypertension     Past Surgical History:  Procedure Laterality Date  . AUGMENTATION MAMMAPLASTY Bilateral   . BILATERAL TOTAL MASTECTOMY WITH AXILLARY LYMPH NODE DISSECTION  2014  . COLONOSCOPY  2004  . MASTECTOMY Bilateral 2014   bil mast/recon    Family History  Problem Relation Age of Onset  . Cancer Mother     breast cancer; lung cancer    Social History:  reports that she does not drink alcohol or use drugs. Her tobacco history is not on file.  She is originally from [place].  She lived in [place], [place], before moving to [place].  The patient is accompanied by her  grand-daughter, [place], today.  Allergies: No Known Allergies  Current Medications: Current Outpatient Prescriptions  Medication Sig Dispense Refill  . cyclobenzaprine (FLEXERIL) 10 MG tablet Take 10 mg by mouth 3 (three) times daily as needed for muscle spasms.    Marland Kitchen exemestane (AROMASIN) 25 MG tablet Take 1 tablet (25 mg total) by mouth daily after breakfast. 30 tablet 5  . ibuprofen (ADVIL,MOTRIN) 800 MG tablet Take 800 mg by mouth every 8  (eight) hours as needed.    . metoprolol succinate (TOPROL-XL) 25 MG 24 hr tablet Take 25 mg by mouth 2 (two) times daily.     Marland Kitchen ALPRAZolam (XANAX) 0.25 MG tablet Take 1 tab prior to scan (Patient not taking: Reported on 05/19/2016) 1 tablet 0  . zolpidem (AMBIEN) 10 MG tablet Take 10 mg by mouth at bedtime.     No current facility-administered medications for this visit.     Review of Systems:  GENERAL:  Feels good.  Active.  No fevers or sweats.  Weight down 2 pounds. PERFORMANCE STATUS (ECOG):  1 HEENT:  No visual changes, runny nose, sore throat, mouth sores or tenderness. Lungs: Little shortness of breath.  Some wheezing.  No cough.  No hemoptysis. Cardiac:  No chest pain, palpitations, orthopnea, or PND. GI:  No nausea, vomiting, diarrhea, constipation, melena or hematochezia.  Colonoscopy in 2004. GU:  No urgency, frequency, dysuria, or hematuria. Musculoskeletal:  Back issues (see HPI).  Herniated disk.  No joint pain.  No muscle tenderness. Extremities:  Right foot cold and numb (old).  Arthritis in knees.  No swelling. Skin:  No rashes or skin changes. Neuro:  No headache, numbness or weakness, balance or coordination issues. Endocrine:  Hot flashes.  No diabetes, thyroid issues, or night sweats. Psych:  Anxiety.  Panic attacks.  No mood changes or depression. Pain:  Chronic back pain secondary to herniated disk. Review of systems:  All other systems reviewed and found to be negative.  Physical Exam: Blood pressure 114/74, pulse 73, temperature 98.6 F (37 C), temperature source Tympanic, resp. rate 18, weight 233 lb 5.7 oz (105.8 kg), SpO2 96 %. GENERAL:  Well developed, well nourished, woman sitting comfortably in the exam room in no acute distress. MENTAL STATUS:  Alert and oriented to person, place and time. HEAD:  Short brown hair.  Normocephalic, atraumatic, face symmetric, no Cushingoid features. EYES:  Brown eyes.  Pupils equal round and reactive to light and  accomodation.  No conjunctivitis or scleral icterus. ENT:  Oropharynx clear without lesion.  Tongue normal. Mucous membranes moist.  RESPIRATORY:  Clear to auscultation without rales, wheezes or rhonchi. CARDIOVASCULAR:  Regular rate and rhythm without murmur, rub or gallop. BREAST:  s/p bilateral mastectomies with implants and reconstruction.  No masses, skin changes or nodularity.  ABDOMEN:  Soft, non-tender, with active bowel sounds, and no appreciable hepatosplenomegaly.  No masses. SKIN:  No rashes, ulcers or lesions. EXTREMITIES: No edema, no skin discoloration or tenderness.  No palpable cords. LYMPH NODES: No palpable cervical, supraclavicular, axillary or inguinal adenopathy  NEUROLOGICAL: Unremarkable. PSYCH:  Appropriate.   Appointment on 05/19/2016  Component Date Value Ref Range Status  . WBC 05/19/2016 9.5  3.6 - 11.0 K/uL Final  . RBC 05/19/2016 4.61  3.80 - 5.20 MIL/uL Final  . Hemoglobin 05/19/2016 14.4  12.0 - 16.0 g/dL Final  . HCT 05/19/2016 42.4  35.0 - 47.0 % Final  . MCV 05/19/2016 92.1  80.0 - 100.0 fL Final  . [facility] 05/19/2016 31.1  26.0 - 34.0 pg Final  . MCHC 05/19/2016 33.8  32.0 - 36.0 g/dL Final  . RDW 05/19/2016 13.4  11.5 - 14.5 % Final  . Platelets 05/19/2016 254  150 - 440 K/uL Final  . Neutrophils Relative % 05/19/2016 51  % Final  . Neutro Abs 05/19/2016 4.8  1.4 - 6.5 K/uL Final  . Lymphocytes Relative 05/19/2016 37  % Final  . Lymphs Abs 05/19/2016 3.5  1.0 - 3.6 K/uL Final  . Monocytes Relative 05/19/2016 7  % Final  . Monocytes Absolute 05/19/2016 0.7  0.2 - 0.9 K/uL Final  . Eosinophils Relative 05/19/2016 4  % Final  . Eosinophils Absolute 05/19/2016 0.4  0 - 0.7 K/uL Final  . Basophils Relative 05/19/2016 1  % Final  . Basophils Absolute 05/19/2016 0.1  0 - 0.1 K/uL Final  . Sodium 05/19/2016 139  135 - 145 mmol/L Final  . Potassium 05/19/2016 4.5  3.5 - 5.1 mmol/L Final  . Chloride 05/19/2016 105  101 - 111 mmol/L Final  . CO2 05/19/2016  27  22 - 32 mmol/L Final  . Glucose, Bld 05/19/2016 102* 65 - 99 mg/dL Final  . BUN 05/19/2016 21* 6 - 20 mg/dL Final  . Creatinine, Ser 05/19/2016 0.86  0.44 - 1.00 mg/dL Final  . Calcium 05/19/2016 9.6  8.9 - 10.3 mg/dL Final  . Total Protein 05/19/2016 7.7  6.5 - 8.1 g/dL Final  . Albumin 05/19/2016 4.4  3.5 - 5.0 g/dL Final  . AST 05/19/2016 24  15 - 41 U/L Final  . ALT 05/19/2016 24  14 - 54 U/L Final  . Alkaline Phosphatase 05/19/2016 92  38 - 126 U/L Final  . Total Bilirubin 05/19/2016 0.7  0.3 - 1.2 mg/dL Final  . GFR calc non Af Amer 05/19/2016 >60  >60 mL/min Final  . GFR calc Af Amer 05/19/2016 >60  >60 mL/min Final   Comment: (NOTE) The eGFR has been calculated using the CKD EPI equation. This calculation has not been validated in all clinical situations. eGFR's persistently <60 mL/min signify possible Chronic Kidney Disease.   . Anion gap 05/19/2016 7  5 - 15 Final    Assessment:  Megahn Killings is a 65 y.o. female stage IA right breast cancer status post bilateral nipple sparing mastectomies and right axillary sentinel lymph node biopsy on 04/11/2013.    Initial biopsy on 02/06/2013 revealed at least 5 mm invasive ductal carcinoma, basaloid subtype.  Pathology post mastectomy revealed no invasive carcinoma in the right breast.  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  Margins were negative.  Two right sided sentinel lymph nodes were negative.  Left breast pathology revealed 2 mm DCIS without invasive carcinoma.  Tumor was ER positive (1+), PR positive (1+), and Her2/neu negative.  She underwent immediate reconstruction with expanders followed by additional reconstructive surgery in 09/03/2013 and 12/2013.  Her mother had breast cancer in her 16s.  BRCA1/2 testing was negative on 02/16/2014.  She initially received Arimidex following recovery from surgery.  She describes "a lot of joint pain".  After approximately 1 year on Arimidex, she was switched to  Aromasin for approximately 6 months.  Aromasin was discontinued on 05/08/2015 given the small size and 1+ hormone receptor positivity.  She denies any symptoms while on Aromasin.  She has been on Aromasin since 11/19/2015 and tolerating well.  CA27.29 was 56 on 12/30/2014 and 64.3 on 11/19/2015.  CA15-3 was 30 (normal) on 12/30/2014.  Bone  density study on 10/08/2013 was normal with a T score of 1.4 in the lumbar spine, -0.1 in the left hip, and 0.1 in the right hip.  She has a history of pulmonary nodules. Chest CT scans on 10/27/2013 and 08/24/2014 noted scattered sub-53m nodules.  PET scan in 10/29/2014 revealed no evidence of metastatic disease.  The sub-536mnodules were below the resolution limits of the PET scan.  Chest, abdomen, and pelvic CT scan on 12/19/2015 revealed 7 indeterminate scattered pulmonary nodules in both lungs (largest 7 mm in the RUL)  Each of the 7 pulmonary nodules described on the original 12/19/2015 CT report were stable since 08/24/2014, indicating 15 month stability. These nodules are benign.   Bone scan on 12/19/2015 revealed no abnormal uptake in the lumbar spine.  There was mildly increased uptake in the posterior elements of approximately C3 or C4 on the right and in the body of C7 or T1.   Bone density study on 02/10/2016 revealed osteopenia with a T score of -1.5 in the right femoral neck.  Symptomatically, she has mild shortness of breath and intermittent wheezing.  She is tolerating Aromasin.  She has issues with a herniated disk.  Exam is unremarkable.  Plan: 1.  Labs today:  CBC with diff, CMP, CA27.29. 2.  Review chest, abdomen, and pelvic CT scan.  Pulmonary nodules are stable and likely benign.  Bone scan reveals mild uptake in the cervical spine.  Discuss cervical spine series. 3.  Review bone density study.  Discuss osteopenia.  Discuss calcium and vitamin D.   Discuss exercise.  Discuss a bisphosphonate if bone density decreases. 4.  Continue  Aromasin.   5.  Cervical spine series 6.  RTC in 6 months for MD assessment and labs (CBC with diff, CMP, CA27.29).   MeLequita AsalMD  05/19/2016, 3:20 PM

## 2016-05-19 NOTE — Progress Notes (Signed)
Patient is here today for follow up she does have some shortness of breath with walking slowly

## 2016-05-20 LAB — CANCER ANTIGEN 27.29: CA 27.29: 69.3 U/mL — ABNORMAL HIGH (ref 0.0–38.6)

## 2016-05-23 ENCOUNTER — Encounter: Payer: Self-pay | Admitting: Hematology and Oncology

## 2016-05-28 ENCOUNTER — Ambulatory Visit
Admission: RE | Admit: 2016-05-28 | Discharge: 2016-05-28 | Disposition: A | Discharge status: Home / Self Care | Payer: BLUE CROSS/BLUE SHIELD | Source: Ambulatory Visit | Attending: Hematology and Oncology | Admitting: Hematology and Oncology

## 2016-05-28 DIAGNOSIS — C50911 Malignant neoplasm of unspecified site of right female breast: Secondary | ICD-10-CM | POA: Diagnosis not present

## 2016-05-28 DIAGNOSIS — M47892 Other spondylosis, cervical region: Secondary | ICD-10-CM | POA: Insufficient documentation

## 2016-07-13 IMAGING — CT CT ABD-PELV W/ CM
1 of 3 series · 13 of 30 positions shown, 17 images · IV contrast (iopamidol)
Comparison: None.

ADDENDUM:
Outside chest CT performed at [facility] on 08/24/2014 was submitted for
the purposes of comparison.

Each of the 7 pulmonary nodules described on the original 12/19/2015
CT report are stable since 08/24/2014, indicating 15 month
stability. These nodules are considered benign. The original report
is otherwise unchanged.
CLINICAL DATA: Restaging right breast cancer. Elevated CA 27.29.
Bilateral mastectomy in 9007. Oral chemotherapy.
EXAM:
CT CHEST, ABDOMEN, AND PELVIS WITH CONTRAST
TECHNIQUE: Multidetector CT imaging of the chest, abdomen and pelvis was
performed following the standard protocol during bolus
administration of intravenous contrast.
CONTRAST:  100mL [id number] IOPAMIDOL ([id number]) INJECTION 61%

[Series 2: cap with 2 · axial · 0.79mm/px · z∈[-966,-406]mm · 13 of 129 slices shown, 17 images]
[im 9/129  mediastinal]
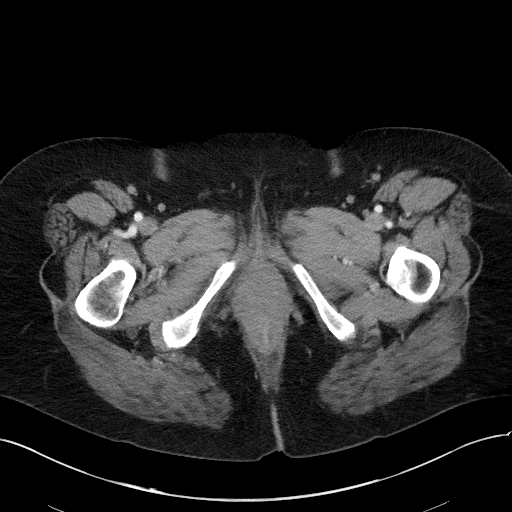
[im 9/129  lung]
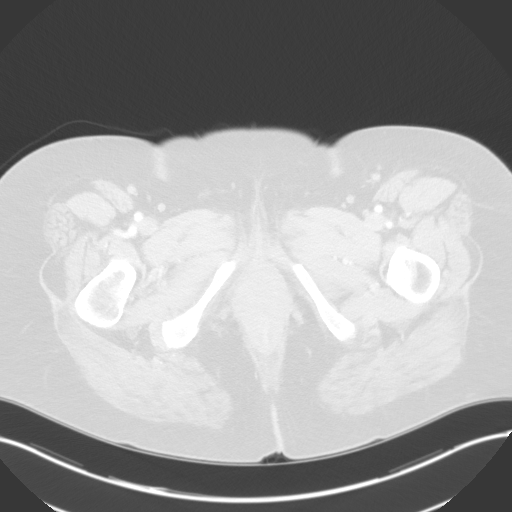
[im 17/129  lung]
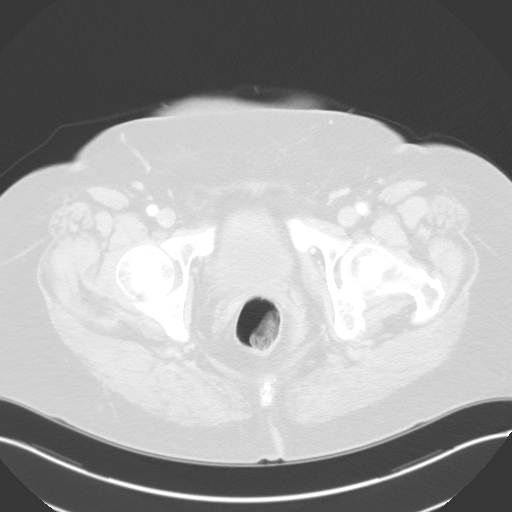
[im 33/129  lung]
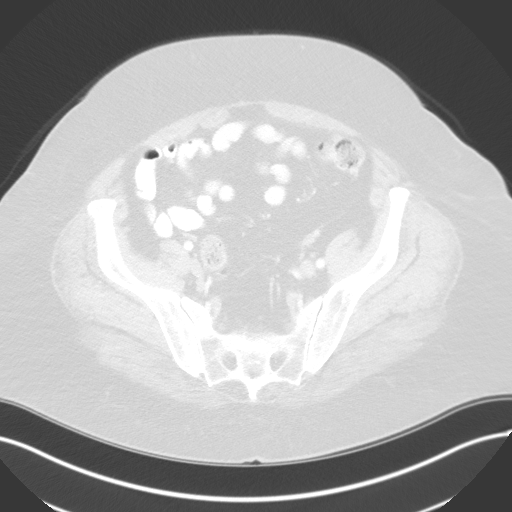
[im 41/129  lung]
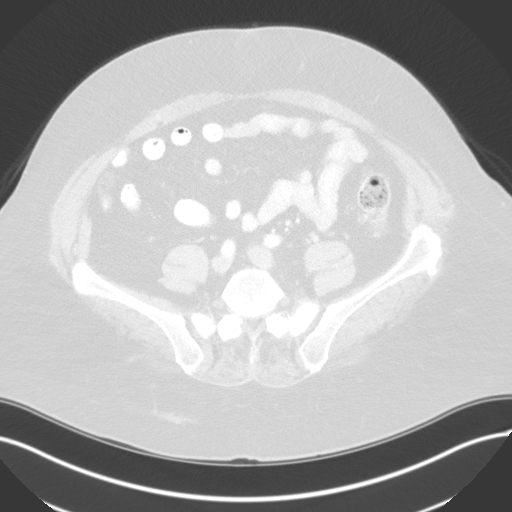
[im 49/129  mediastinal]
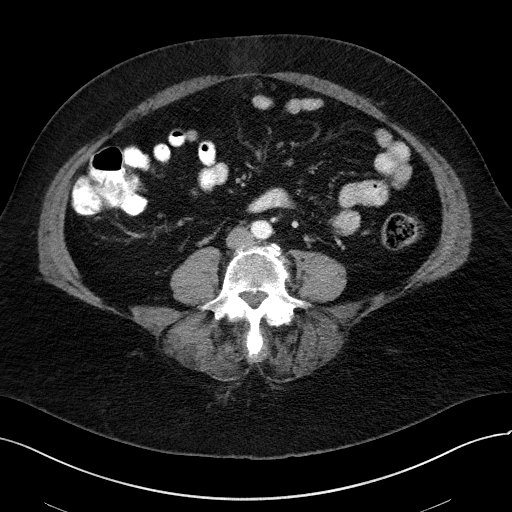
[im 49/129  lung]
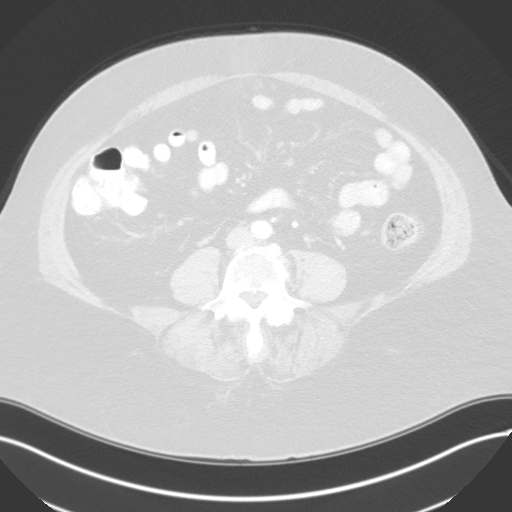
[im 57/129  lung]
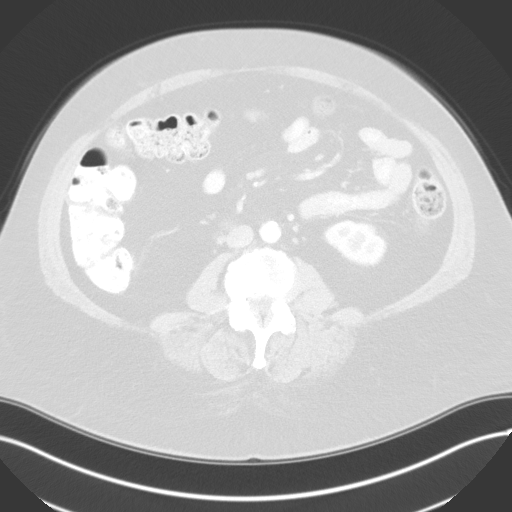
[im 65/129  lung]
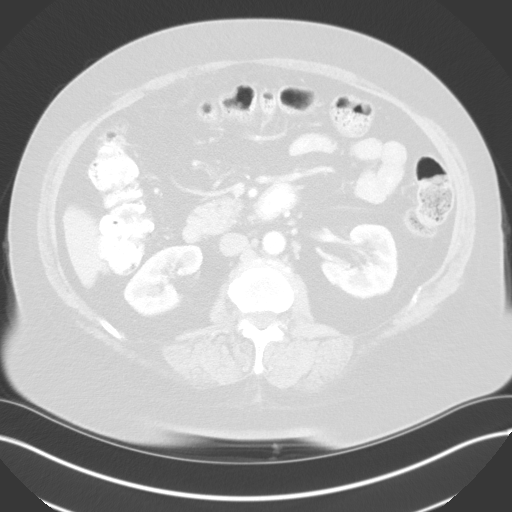
[im 73/129  lung]
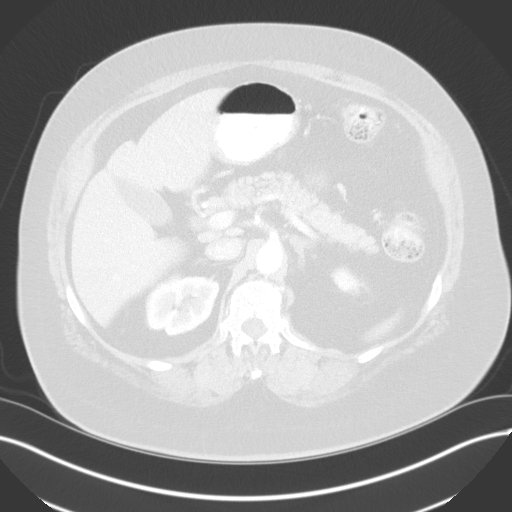
[im 81/129  mediastinal]
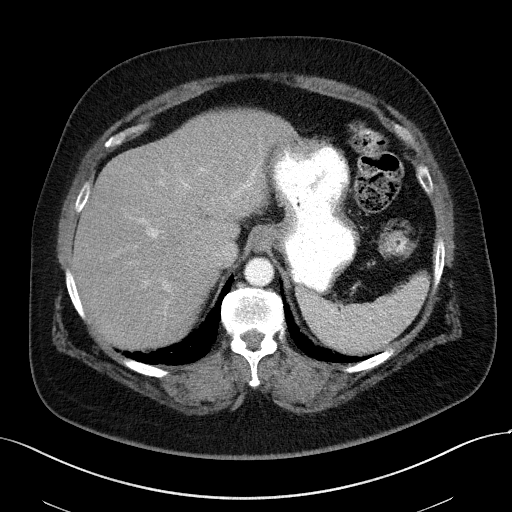
[im 81/129  lung]
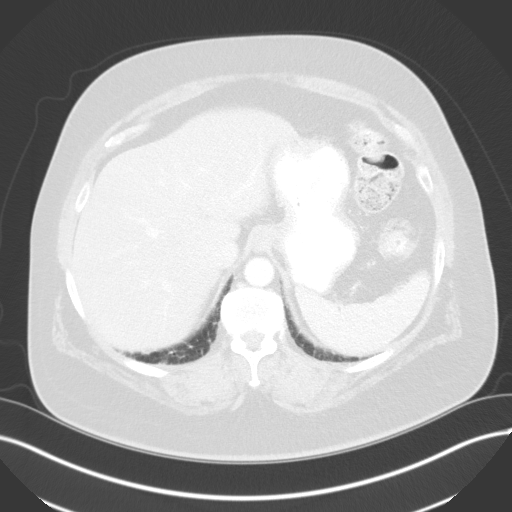
[im 89/129  lung]
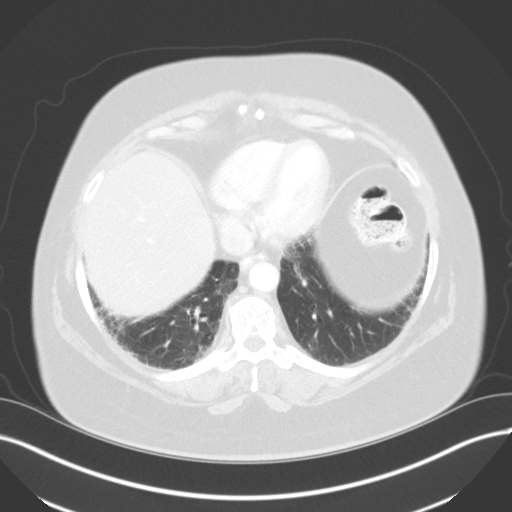
[im 97/129  lung]
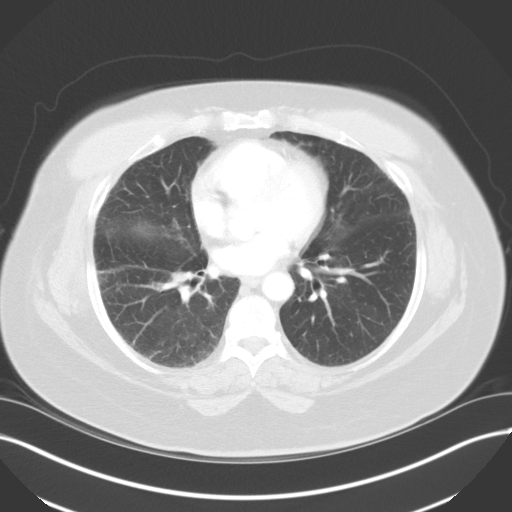
[im 113/129  lung]
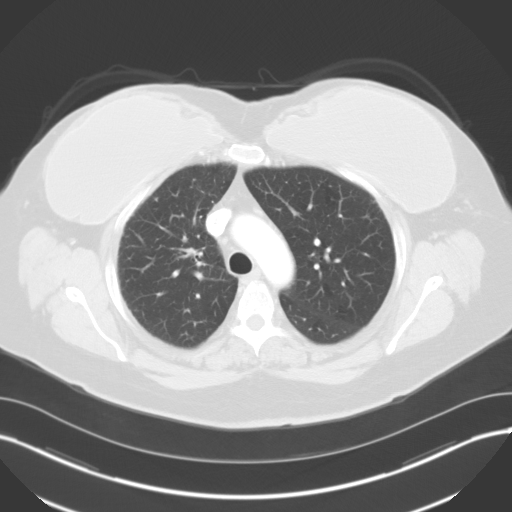
[im 121/129  mediastinal]
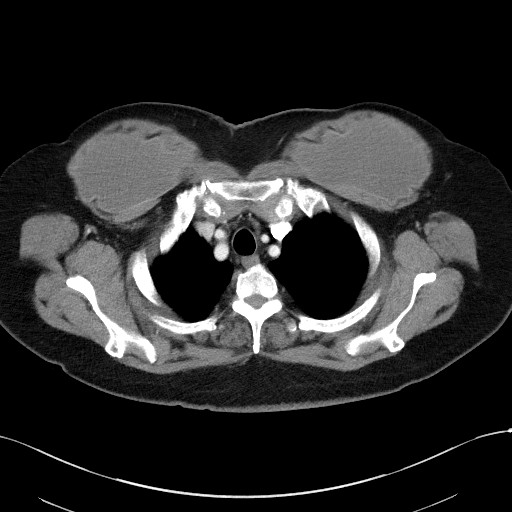
[im 121/129  lung]
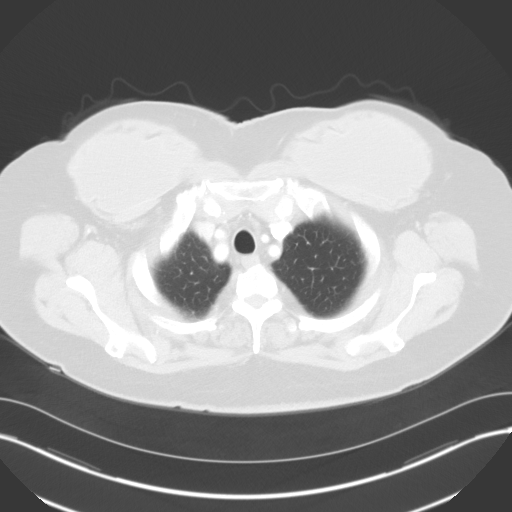

[13 of 30 positions shown; findings below may reference images not displayed]

FINDINGS: CT CHEST

Mediastinum/Nodes: Normal heart size. No pericardial
fluid/thickening. Great vessels are normal in course and caliber. No
central pulmonary emboli. Normal visualized thyroid. Normal
esophagus. No pathologically enlarged axillary, mediastinal or hilar
lymph nodes. There are a few coarsely calcified right paratracheal
and right hilar nodes from prior granulomatous disease.

Lungs/Pleura: No pneumothorax. No pleural effusion. There are seven
scattered pulmonary nodules in both lungs, largest 6 mm in the right
lower lobe (series 4/ image 29), 7 mm in the posterior right upper
lobe (series 4/image 12) and 5 mm in the posterior left upper lobe
(series 4/image 12). There is patchy subpleural reticulation
throughout both lungs with a basilar predominance. There is a mosaic
attenuation in the lungs. No acute consolidative airspace disease or
lung masses.

Musculoskeletal: No aggressive appearing focal osseous lesions.
Mild-to-moderate degenerative changes in the thoracic spine. Status
post bilateral mastectomy with intact appearing bilateral
retropectoral implants. No mass is seen in the ventral chest wall.

CT ABDOMEN AND PELVIS

Hepatobiliary: Suggestion of diffuse hepatic steatosis. No liver
masses. Normal gallbladder with no radiopaque cholelithiasis. No
biliary ductal dilatation.

Pancreas: Normal, with no mass or duct dilation.

Spleen: Normal size. No mass.

Adrenals/Urinary Tract: Normal adrenals. Normal kidneys with no
hydronephrosis and no renal mass. Normal bladder.

Stomach/Bowel: Grossly normal stomach. Normal caliber small bowel
with no small bowel wall thickening. Normal appendix. Mild sigmoid
diverticulosis, with no large bowel wall thickening or pericolonic
fat stranding.

Vascular/Lymphatic: Atherosclerotic nonaneurysmal abdominal aorta.
Patent portal, splenic, hepatic and renal veins. No pathologically
enlarged lymph nodes in the abdomen or pelvis.

Reproductive: Grossly normal uterus.  No adnexal mass.

Other: No pneumoperitoneum, ascites or focal fluid collection.

Musculoskeletal: No aggressive appearing focal osseous lesions.
Marked degenerative changes in the lumbar spine. Moderate fat
containing periumbilical hernia.
IMPRESSION: 1. Seven indeterminate scattered pulmonary nodules in both lungs,
largest 7 mm in the right upper lobe, pulmonary metastases not
excluded. Follow-up chest CT advised in 3 months.
2. No additional potential sites of metastatic disease in the chest,
abdomen or pelvis. No lymphadenopathy. No osseous lesions.
3. Bilateral mastectomy. No CT findings of tumor recurrence in the
ventral chest wall.
4. Patchy basilar predominant subpleural reticulation throughout
both lungs, which could indicate an interstitial lung disease such
as nonspecific interstitial pneumonia (NSIP).
5. Additional findings include moderate fat containing periumbilical
hernia, suggestion of diffuse hepatic steatosis and mild sigmoid
diverticulosis.

## 2016-11-17 ENCOUNTER — Inpatient Hospital Stay (HOSPITAL_BASED_OUTPATIENT_CLINIC_OR_DEPARTMENT_OTHER): Payer: Managed Care, Other (non HMO) | Admitting: Hematology and Oncology

## 2016-11-17 ENCOUNTER — Inpatient Hospital Stay: Payer: Managed Care, Other (non HMO) | Attending: Hematology and Oncology

## 2016-11-17 ENCOUNTER — Encounter: Payer: Self-pay | Admitting: Hematology and Oncology

## 2016-11-17 VITALS — BP 109/69 | HR 68 | Temp 98.6°F | Resp 18 | Wt 231.3 lb

## 2016-11-17 DIAGNOSIS — R05 Cough: Secondary | ICD-10-CM | POA: Diagnosis not present

## 2016-11-17 DIAGNOSIS — M7989 Other specified soft tissue disorders: Secondary | ICD-10-CM

## 2016-11-17 DIAGNOSIS — Z79811 Long term (current) use of aromatase inhibitors: Secondary | ICD-10-CM | POA: Diagnosis not present

## 2016-11-17 DIAGNOSIS — D0512 Intraductal carcinoma in situ of left breast: Secondary | ICD-10-CM

## 2016-11-17 DIAGNOSIS — Z170 Estrogen receptor positive status [ER+]: Secondary | ICD-10-CM

## 2016-11-17 DIAGNOSIS — C50911 Malignant neoplasm of unspecified site of right female breast: Secondary | ICD-10-CM

## 2016-11-17 DIAGNOSIS — F419 Anxiety disorder, unspecified: Secondary | ICD-10-CM | POA: Insufficient documentation

## 2016-11-17 DIAGNOSIS — M85861 Other specified disorders of bone density and structure, right lower leg: Secondary | ICD-10-CM

## 2016-11-17 DIAGNOSIS — Z9013 Acquired absence of bilateral breasts and nipples: Secondary | ICD-10-CM | POA: Diagnosis not present

## 2016-11-17 DIAGNOSIS — D0511 Intraductal carcinoma in situ of right breast: Secondary | ICD-10-CM | POA: Insufficient documentation

## 2016-11-17 DIAGNOSIS — I10 Essential (primary) hypertension: Secondary | ICD-10-CM | POA: Diagnosis not present

## 2016-11-17 DIAGNOSIS — M8580 Other specified disorders of bone density and structure, unspecified site: Secondary | ICD-10-CM | POA: Diagnosis not present

## 2016-11-17 DIAGNOSIS — Z853 Personal history of malignant neoplasm of breast: Secondary | ICD-10-CM | POA: Diagnosis present

## 2016-11-17 LAB — COMPREHENSIVE METABOLIC PANEL
ALT: 20 U/L (ref 14–54)
AST: 23 U/L (ref 15–41)
Albumin: 3.9 g/dL (ref 3.5–5.0)
Alkaline Phosphatase: 102 U/L (ref 38–126)
Anion gap: 7 (ref 5–15)
BUN: 17 mg/dL (ref 6–20)
CO2: 27 mmol/L (ref 22–32)
Calcium: 9.3 mg/dL (ref 8.9–10.3)
Chloride: 102 mmol/L (ref 101–111)
Creatinine, Ser: 0.86 mg/dL (ref 0.44–1.00)
GFR calc Af Amer: >60 mL/min (ref >60)
GFR calc non Af Amer: >60 mL/min (ref >60)
Glucose, Bld: 103 mg/dL — ABNORMAL HIGH (ref 65–99)
Potassium: 4.4 mmol/L (ref 3.5–5.1)
Sodium: 136 mmol/L (ref 135–145)
Total Bilirubin: 0.6 mg/dL (ref 0.3–1.2)
Total Protein: 7.3 g/dL (ref 6.5–8.1)

## 2016-11-17 LAB — CBC WITH DIFFERENTIAL/PLATELET
Basophils Absolute: 0.1 10*3/uL (ref 0–0.1)
Basophils Relative: 1 %
Eosinophils Absolute: 0.3 10*3/uL (ref 0–0.7)
Eosinophils Relative: 4 %
HCT: 41.8 % (ref 35.0–47.0)
Hemoglobin: 13.9 g/dL (ref 12.0–16.0)
Lymphocytes Relative: 31 %
Lymphs Abs: 2.4 10*3/uL (ref 1.0–3.6)
MCH: 30.3 pg (ref 26.0–34.0)
MCHC: 33.3 g/dL (ref 32.0–36.0)
MCV: 91.2 fL (ref 80.0–100.0)
Monocytes Absolute: 0.6 10*3/uL (ref 0.2–0.9)
Monocytes Relative: 8 %
Neutro Abs: 4.5 10*3/uL (ref 1.4–6.5)
Neutrophils Relative %: 56 %
Platelets: 248 10*3/uL (ref 150–440)
RBC: 4.58 MIL/uL (ref 3.80–5.20)
RDW: 13.7 % (ref 11.5–14.5)
WBC: 7.9 10*3/uL (ref 3.6–11.0)

## 2016-11-17 NOTE — Progress Notes (Signed)
[facility] day:  11/17/16   Chief Complaint: Brittany Ramos is a 66 y.o. female with stage IA right breast cancer who is seen for 6 month assessment.  HPI:   The patient was last seen in the medical oncology clinic on 05/19/2016.  At that time, she was seen for initial assessment.  She had undergone bilateral mastectomy in 03/2013 while living in [place].  She has only received 1 year of Arimidex and 6 months of Aromasin secondary to arthalgias, small tumor size, and 1+ hormone receptor positivity per her prior oncologist. She had a history of an elevated CA27.29 in 12/2014.   At last visit, she noted mild vasomotor symptoms.  She had issues with a herniated disk.  Exam was unremarkable.  She was given a prescription from Aromasin.  CA27.29 was 64.3 (0-38.6).  She was contacted regarding her elevated tumor marker.  A PET scan was ordered, but denied.  She underwent chest, abdomen, and pelvic CT scan on 12/19/2015.  CT scan revealed 7 indeterminate scattered pulmonary nodules in both lungs, largest 7 mm in the right upper lobe  There were no additional potential sites of metastatic disease in the chest, abdomen or pelvis. There was no lymphadenopathy or osseous lesions.  There was patchy basilar predominant subpleural reticulation throughout both lungs, which could indicate an interstitial lung disease such as nonspecific interstitial pneumonia (NSIP).  Additional findings included moderate fat containing periumbilical hernia, suggestion of diffuse hepatic steatosis and mild sigmoid diverticulosis.  Following her imaging, outside chest CT scan from 08/24/2014 was reviewed by radiology.  Each of the 7 pulmonary nodules described on the original 12/19/2015 CT report were stable since 08/24/2014, indicating 15 month stability. These nodules are considered benign.   Bone scan on 12/19/2015 revealed no abnormal uptake in the lumbar spine.  There was mildly increased  uptake in the posterior elements of approximately C3 or C4 on the right and in the body of C7 or T1. A cervical spine series was recommended.  There was increased uptake associated with the left sternoclavicular joint, both knees, and the right foot is compatible with degenerative change.  Cervical spine series on 05/28/2016 revealed advanced cervical spondylosis without evidence of metastatic disease.  Bone density study on 02/10/2016 revealed osteopenia with a T score of -1.5 in the right femoral neck.  Symptomatically, she feels good.  She notes occasional wheezing and coughing but states she is recovering from bronchitis. She was started on antibiotics by her PCP 3 weeks ago.  She denies any pleuritic chest pain.  She admits to right lower extremity swelling.  She has some burning in the right lower extremity. She denies any breast concerns.  She has some back issues due to a "pinched nerve" for which she is being followed by Dr. Dolores Frame.    Past Medical History:  Diagnosis Date  . Anxiety   . Arthritis   . Breast cancer, right ([place]) 2014   Bilateral Mastectomies, chemo pill.   . Hypertension     Past Surgical History:  Procedure Laterality Date  . AUGMENTATION MAMMAPLASTY Bilateral   . BILATERAL TOTAL MASTECTOMY WITH AXILLARY LYMPH NODE DISSECTION  2014  . COLONOSCOPY  2004  . MASTECTOMY Bilateral 2014   bil mast/recon    Family History  Problem Relation Age of Onset  . Cancer Mother     breast cancer; lung cancer    Social History:  reports that she does not drink alcohol or  use drugs. Her tobacco history is not on file.  She is originally from [place].  She lived in [place], [place], before moving to [place].  She lives in [place].  The patient is alone today.  Allergies: No Known Allergies  Current Medications: Current Outpatient Prescriptions  Medication Sig Dispense Refill  . cyclobenzaprine (FLEXERIL) 10 MG tablet Take 10 mg by mouth 3 (three) times daily as  needed for muscle spasms.    Marland Kitchen exemestane (AROMASIN) 25 MG tablet Take 1 tablet (25 mg total) by mouth daily after breakfast. 90 tablet 3  . ibuprofen (ADVIL,MOTRIN) 800 MG tablet Take 800 mg by mouth every 8 (eight) hours as needed.    . metoprolol succinate (TOPROL-XL) 25 MG 24 hr tablet Take 25 mg by mouth 2 (two) times daily.     Marland Kitchen zolpidem (AMBIEN) 10 MG tablet Take 10 mg by mouth at bedtime.    . ALPRAZolam (XANAX) 0.25 MG tablet Take 1 tab prior to scan (Patient not taking: Reported on 05/19/2016) 1 tablet 0   No current facility-administered medications for this visit.     Review of Systems:  GENERAL:  Feels good.  Active.  No fevers or sweats.  Weight down 2 pounds. PERFORMANCE STATUS (ECOG):  1 HEENT:  No visual changes, runny nose, sore throat, mouth sores or tenderness. Lungs: Little shortness of breath.  Some wheezing.  No cough.  No hemoptysis. Cardiac:  No chest pain, palpitations, orthopnea, or PND. GI:  No nausea, vomiting, diarrhea, constipation, melena or hematochezia.  Colonoscopy in 2004. GU:  No urgency, frequency, dysuria, or hematuria. Musculoskeletal:  Back issues (see HPI).  Herniated disk.  No joint pain.  No muscle tenderness. Extremities:  Right foot cold and numb (old).  Arthritis in knees.  No swelling. Skin:  No rashes or skin changes. Neuro:  No headache, numbness or weakness, balance or coordination issues. Endocrine:  Hot flashes.  No diabetes, thyroid issues, or night sweats. Psych:  Anxiety.  Panic attacks.  No mood changes or depression. Pain:  Chronic back pain secondary to herniated disk. Review of systems:  All other systems reviewed and found to be negative.  Physical Exam: Blood pressure 109/69, pulse 68, temperature 98.6 F (37 C), temperature source Tympanic, resp. rate 18, weight 231 lb 4.2 oz (104.9 kg). GENERAL:  Well developed, well nourished, woman sitting comfortably in the exam room in no acute distress. MENTAL STATUS:  Alert and  oriented to person, place and time. HEAD:  Short brown hair.  Normocephalic, atraumatic, face symmetric, no Cushingoid features. EYES:  Glasses.  Brown eyes.  Pupils equal round and reactive to light and accomodation.  No conjunctivitis or scleral icterus. ENT:  Oropharynx clear without lesion.  Tongue normal. Mucous membranes moist.  RESPIRATORY:  Clear to auscultation without rales, wheezes or rhonchi. CARDIOVASCULAR:  Regular rate and rhythm without murmur, rub or gallop. BREAST:  s/p bilateral mastectomies with implants and reconstruction.  No masses, skin changes or nodularity.  ABDOMEN:  Soft, non-tender, with active bowel sounds, and no appreciable hepatosplenomegaly.  No masses. SKIN:  No rashes, ulcers or lesions. EXTREMITIES: No edema, no skin discoloration or tenderness.  No palpable cords. LYMPH NODES: No palpable cervical, supraclavicular, axillary or inguinal adenopathy  NEUROLOGICAL: Unremarkable. PSYCH:  Appropriate.   Appointment on 11/17/2016  Component Date Value Ref Range Status  . WBC 11/17/2016 7.9  3.6 - 11.0 K/uL Final  . RBC 11/17/2016 4.58  3.80 - 5.20 MIL/uL Final  . Hemoglobin 11/17/2016 13.9  12.0 - 16.0 g/dL Final  . HCT 11/17/2016 41.8  35.0 - 47.0 % Final  . MCV 11/17/2016 91.2  80.0 - 100.0 fL Final  . MCH 11/17/2016 30.3  26.0 - 34.0 pg Final  . MCHC 11/17/2016 33.3  32.0 - 36.0 g/dL Final  . RDW 11/17/2016 13.7  11.5 - 14.5 % Final  . Platelets 11/17/2016 248  150 - 440 K/uL Final  . Neutrophils Relative % 11/17/2016 56  % Final  . Neutro Abs 11/17/2016 4.5  1.4 - 6.5 K/uL Final  . Lymphocytes Relative 11/17/2016 31  % Final  . Lymphs Abs 11/17/2016 2.4  1.0 - 3.6 K/uL Final  . Monocytes Relative 11/17/2016 8  % Final  . Monocytes Absolute 11/17/2016 0.6  0.2 - 0.9 K/uL Final  . Eosinophils Relative 11/17/2016 4  % Final  . Eosinophils Absolute 11/17/2016 0.3  0 - 0.7 K/uL Final  . Basophils Relative 11/17/2016 1  % Final  . Basophils Absolute  11/17/2016 0.1  0 - 0.1 K/uL Final  . Sodium 11/17/2016 136  135 - 145 mmol/L Final  . Potassium 11/17/2016 4.4  3.5 - 5.1 mmol/L Final  . Chloride 11/17/2016 102  101 - 111 mmol/L Final  . CO2 11/17/2016 27  22 - 32 mmol/L Final  . Glucose, Bld 11/17/2016 103* 65 - 99 mg/dL Final  . BUN 11/17/2016 17  6 - 20 mg/dL Final  . Creatinine, Ser 11/17/2016 0.86  0.44 - 1.00 mg/dL Final  . Calcium 11/17/2016 9.3  8.9 - 10.3 mg/dL Final  . Total Protein 11/17/2016 7.3  6.5 - 8.1 g/dL Final  . Albumin 11/17/2016 3.9  3.5 - 5.0 g/dL Final  . AST 11/17/2016 23  15 - 41 U/L Final  . ALT 11/17/2016 20  14 - 54 U/L Final  . Alkaline Phosphatase 11/17/2016 102  38 - 126 U/L Final  . Total Bilirubin 11/17/2016 0.6  0.3 - 1.2 mg/dL Final  . GFR calc non Af Amer 11/17/2016 >60  >60 mL/min Final  . GFR calc Af Amer 11/17/2016 >60  >60 mL/min Final   Comment: (NOTE) The eGFR has been calculated using the CKD EPI equation. This calculation has not been validated in all clinical situations. eGFR's persistently <60 mL/min signify possible Chronic Kidney Disease.   . Anion gap 11/17/2016 7  5 - 15 Final    Assessment:  Carole Doner is a 66 y.o. female stage IA right breast cancer status post bilateral nipple sparing mastectomies and right axillary sentinel lymph node biopsy on 04/11/2013.    Initial biopsy on 02/06/2013 revealed at least 5 mm invasive ductal carcinoma, basaloid subtype.  Pathology post mastectomy revealed no invasive carcinoma in the right breast.  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  Margins were negative.  Two right sided sentinel lymph nodes were negative.  Left breast pathology revealed 2 mm DCIS without invasive carcinoma.  Tumor was ER positive (1+), PR positive (1+), and Her2/neu negative.  She underwent immediate reconstruction with expanders followed by additional reconstructive surgery in 09/03/2013 and 12/2013.  Her mother had breast cancer in her 40s.   BRCA1/2 testing was negative on 02/16/2014.  She initially received Arimidex following recovery from surgery.  She describes "a lot of joint pain".  After approximately 1 year on Arimidex, she was switched to Aromasin for approximately 6 months.  Aromasin was discontinued on 05/08/2015 given the small size and 1+ hormone receptor positivity.  She denies any  symptoms while on Aromasin.  She has been on Aromasin since 11/19/2015 and tolerating well.  CA27.29 has been followed: 56 on 12/30/2014, 64.3 on 11/19/2015, 69.3 on 05/19/2016, and 69.9 on 11/17/2016.  CA15-3 was 30 (normal) on 12/30/2014.  Bone density study on 10/08/2013 was normal with a T score of 1.4 in the lumbar spine, -0.1 in the left hip, and 0.1 in the right hip.  She has a history of pulmonary nodules. Chest CT scans on 10/27/2013 and 08/24/2014 noted scattered sub-63m nodules.  PET scan in 10/29/2014 revealed no evidence of metastatic disease.  The sub-577mnodules were below the resolution limits of the PET scan.  Chest, abdomen, and pelvic CT on 12/19/2015 revealed 7 indeterminate scattered pulmonary nodules in both lungs (largest 7 mm in the RUL)  Each of the 7 pulmonary nodules described on the original 12/19/2015 CT report were stable since 08/24/2014, indicating 15 month stability. These nodules are benign.   Bone scan on 12/19/2015 revealed no abnormal uptake in the lumbar spine.  There was mildly increased uptake in the posterior elements of approximately C3 or C4 on the right and in the body of C7 or T1.   Bone density study on 02/10/2016 revealed osteopenia with a T score of -1.5 in the right femoral neck.  Symptomatically, she has mild shortness of breath and intermittent wheezing and coughing. She has occasional right lower extremity swelling and burning. She is tolerating Aromasin.  She has issues with a herniated disk. Exam is unremarkable.  Plan: 1.  Labs today:  CBC with diff, CMP, CA27.29. 2.  Elevated CA27.29: If  CA27.29 today is 10% higher then last recorded, then will re-request a PET Scan. Patient has new health insurance. PET was denied at last request. 3.  Continue Aromasin. 4.  Continue calcium and vitamin D.  5.  Anticipate next bone density study on 02/09/2018. 6.  RTC in 6 months for MD assessment and labs (CBC with diff, CMP, CA27.29).  I saw and evaluated the patient, participating in the key portions of the service and reviewing pertinent diagnostic studies and records.  I reviewed the nurse practitioner's note and agree with the findings and the plan.  The assessment and plan were discussed with the patient.   Several questions were asked by the patient and answered.    JeFaythe CasaNP  MeLequita AsalMD  11/17/2016, 4:43 PM

## 2016-11-17 NOTE — Progress Notes (Signed)
Patient states her right foot swells on top during the day.  Burning sensation at night on top of her foot.  Elevates her foot while sitting.

## 2016-11-18 ENCOUNTER — Telehealth: Payer: Self-pay | Admitting: *Deleted

## 2016-11-18 LAB — CANCER ANTIGEN 27.29: CA 27.29: 69.9 U/mL — ABNORMAL HIGH (ref 0.0–38.6)

## 2016-11-18 NOTE — Telephone Encounter (Signed)
Attempted to call patient. No answer.  Will try calling back.

## 2016-11-18 NOTE — Telephone Encounter (Signed)
-----   Message from Lequita Asal, MD sent at 11/18/2016  2:37 AM EST ----- Regarding: Call patient  Tumor marker is the same (elevated) as it was 6 months ago.  Could try to get PET approved with new insurance company or recheck in 3 months.  M  ----- Message ----- From: Interface, Lab In [place] Sent: 11/17/2016   3:50 PM To: Lequita Asal, MD

## 2016-11-18 NOTE — Telephone Encounter (Signed)
Attempted to call patient.  No answer.  Also tried cell phone and it was busy.

## 2016-11-22 ENCOUNTER — Telehealth: Payer: Self-pay | Admitting: *Deleted

## 2016-11-22 NOTE — Telephone Encounter (Signed)
Attempted to call patient, no answer, left message for patient to call cancer center for results of labs.

## 2016-12-12 ENCOUNTER — Encounter: Payer: Self-pay | Admitting: Hematology and Oncology

## 2016-12-15 ENCOUNTER — Telehealth: Payer: Self-pay | Admitting: *Deleted

## 2016-12-15 NOTE — Telephone Encounter (Signed)
Called patient to inform her that her tumor marker is the same as it was 6 mo ago.  MD wants to know if patient would want to get PET scan if we can get approval with new insurance or recheck in 3 mos.  Patient states she will leave decision up to MD.

## 2016-12-16 ENCOUNTER — Other Ambulatory Visit: Payer: Self-pay | Admitting: *Deleted

## 2016-12-16 ENCOUNTER — Other Ambulatory Visit: Payer: Self-pay | Admitting: Hematology and Oncology

## 2016-12-16 ENCOUNTER — Telehealth: Payer: Self-pay | Admitting: *Deleted

## 2016-12-16 DIAGNOSIS — Z170 Estrogen receptor positive status [ER+]: Principal | ICD-10-CM

## 2016-12-16 DIAGNOSIS — C50911 Malignant neoplasm of unspecified site of right female breast: Secondary | ICD-10-CM

## 2016-12-16 MED ORDER — ALPRAZOLAM 0.25 MG PO TABS: ORAL_TABLET | ORAL | 0 refills | Status: AC

## 2016-12-16 NOTE — Telephone Encounter (Signed)
Called patient and informed her that the xanax had been called into the pharmacy and that she had to have a driver for the procedure, voiced understanding.

## 2016-12-21 ENCOUNTER — Ambulatory Visit: Payer: Managed Care, Other (non HMO)

## 2016-12-22 ENCOUNTER — Ambulatory Visit: Payer: Managed Care, Other (non HMO) | Admitting: Hematology and Oncology

## 2016-12-23 ENCOUNTER — Telehealth: Payer: Self-pay | Admitting: *Deleted

## 2016-12-23 ENCOUNTER — Ambulatory Visit
Admission: RE | Admit: 2016-12-23 | Discharge: 2016-12-23 | Disposition: A | Discharge status: Home / Self Care | Payer: Managed Care, Other (non HMO) | Source: Ambulatory Visit | Attending: Hematology and Oncology | Admitting: Hematology and Oncology

## 2016-12-23 DIAGNOSIS — I700 Atherosclerosis of aorta: Secondary | ICD-10-CM | POA: Diagnosis not present

## 2016-12-23 DIAGNOSIS — Z9013 Acquired absence of bilateral breasts and nipples: Secondary | ICD-10-CM

## 2016-12-23 DIAGNOSIS — K5730 Diverticulosis of large intestine without perforation or abscess without bleeding: Secondary | ICD-10-CM | POA: Diagnosis not present

## 2016-12-23 DIAGNOSIS — K429 Umbilical hernia without obstruction or gangrene: Secondary | ICD-10-CM | POA: Insufficient documentation

## 2016-12-23 DIAGNOSIS — R918 Other nonspecific abnormal finding of lung field: Secondary | ICD-10-CM | POA: Diagnosis not present

## 2016-12-23 DIAGNOSIS — Z170 Estrogen receptor positive status [ER+]: Secondary | ICD-10-CM | POA: Diagnosis present

## 2016-12-23 DIAGNOSIS — C50911 Malignant neoplasm of unspecified site of right female breast: Secondary | ICD-10-CM | POA: Insufficient documentation

## 2016-12-23 DIAGNOSIS — F40240 Claustrophobia: Secondary | ICD-10-CM

## 2016-12-23 HISTORY — DX: Claustrophobia: F40.240

## 2016-12-23 HISTORY — DX: Acquired absence of bilateral breasts and nipples: Z90.13

## 2016-12-23 LAB — GLUCOSE, CAPILLARY: Glucose-Capillary: 90 mg/dL (ref 65–99)

## 2016-12-23 MED ORDER — FLUDEOXYGLUCOSE F - 18 (FDG) INJECTION
12.0000 | Freq: Once | INTRAVENOUS | Status: AC | PRN
  Administered 2016-12-23: 12.9 via INTRAVENOUS

## 2016-12-23 NOTE — Telephone Encounter (Signed)
Called patient to let her know that her PET scan was WNL per Dr. Mike Gip, voiced understanding

## 2016-12-28 ENCOUNTER — Ambulatory Visit: Payer: Managed Care, Other (non HMO) | Admitting: Oncology

## 2017-01-05 ENCOUNTER — Inpatient Hospital Stay: Payer: Managed Care, Other (non HMO) | Attending: Hematology and Oncology | Admitting: Hematology and Oncology

## 2017-01-05 NOTE — Progress Notes (Deleted)
[facility] day:  01/05/17   Chief Complaint: Brittany Ramos is a 66 y.o. female with stage IA right breast cancer who is seen for review of PET scan and 7 month assessment.  HPI:   The patient was last seen in the medical oncology clinic on 05/19/2016.  At that time,   PET scan on 12/23/2016 revealed no metabolic evidence of metastatic disease.   There was no metabolic evidence of recurrent disease in the chest wall.  There was continued stability of scattered pulmonary nodules, all stable back to 08/24/2014, below PET resolution, considered benign.  There was mosaic attenuation in the lungs, nonspecific finding most commonly due to air trapping from small airways disease.  There was patchy subpleural reticulation at the lung bases, not well evaluated on these low-dose CT images, grossly stable, see the 12/19/2015 chest CT report for further details.  Additional findings included aortic atherosclerosis, mild colonic diverticulosis and moderate fat containing periumbilical hernia.   she was seen for initial assessment.  She had undergone bilateral mastectomy in 03/2013 while living in [place].  She has only received 1 year of Arimidex and 6 months of Aromasin secondary to arthalgias, small tumor size, and 1+ hormone receptor positivity per her prior oncologist. She had a history of an elevated CA27.29 in 12/2014.   At last visit, she noted mild vasomotor symptoms.  She had issues with a herniated disk.  Exam was unremarkable.  She was given a prescription from Aromasin.  CA27.29 was 64.3 (0-38.6).  She was contacted regarding her elevated tumor marker.  A PET scan was ordered, but denied.  She underwent chest, abdomen, and pelvic CT scan on 12/19/2015.  CT scan revealed 7 indeterminate scattered pulmonary nodules in both lungs, largest 7 mm in the right upper lobe  There were no additional potential sites of metastatic disease in the chest, abdomen or pelvis.  There was no lymphadenopathy or osseous lesions.  There was patchy basilar predominant subpleural reticulation throughout both lungs, which could indicate an interstitial lung disease such as nonspecific interstitial pneumonia (NSIP).  Additional findings included moderate fat containing periumbilical hernia, suggestion of diffuse hepatic steatosis and mild sigmoid diverticulosis.  Following her imaging, outside chest CT scan from 08/24/2014 was reviewed by radiology.  Each of the 7 pulmonary nodules described on the original 12/19/2015 CT report were stable since 08/24/2014, indicating 15 month stability. These nodules are considered benign.   Bone scan on 12/19/2015 revealed no abnormal uptake in the lumbar spine.  There was mildly increased uptake in the posterior elements of approximately C3 or C4 on the right and in the body of C7 or T1. A cervical spine series was recommended.  There was increased uptake associated with the left sternoclavicular joint, both knees, and the right foot is compatible with degenerative change.  Bone density study on 02/10/2016 revealed osteopenia with a T score of -1.5 in the right femoral neck.  Symptomatically, she feels good.  She notes a little shortness of breath.  She has had some wheezing.  She denies any pleuritic chest pain.  She denies any lower extremity edema.  She denies any breast concerns.  She has some back issues due to a "pinched nerve" for which is is following up Dr. Dolores Frame.   Past Medical History:  Diagnosis Date  . Anxiety   . Arthritis   . Breast cancer, right ([place]) 2014   Bilateral Mastectomies, chemo pill.   . Claustrophobia 12/23/2016  Must take medication to get through a scan.  . H/O bilateral mastectomy 12/23/2016  . Hypertension     Past Surgical History:  Procedure Laterality Date  . AUGMENTATION MAMMAPLASTY Bilateral   . BILATERAL TOTAL MASTECTOMY WITH AXILLARY LYMPH NODE DISSECTION  2014  . COLONOSCOPY  2004  .  MASTECTOMY Bilateral 2014   bil mast/recon    Family History  Problem Relation Age of Onset  . Cancer Mother     breast cancer; lung cancer    Social History:  has an unknown smoking status. She has never used smokeless tobacco. She reports that she does not drink alcohol or use drugs.  She is originally from [place].  She lived in [place], [place], before moving to [place].  The patient is accompanied by her grand-daughter, [name], today.  Allergies: No Known Allergies  Current Medications: Current Outpatient Prescriptions  Medication Sig Dispense Refill  . ALPRAZolam (XANAX) 0.25 MG tablet Take 1 tab prior to scan 1 tablet 0  . cyclobenzaprine (FLEXERIL) 10 MG tablet Take 10 mg by mouth 3 (three) times daily as needed for muscle spasms.    . exemestane (AROMASIN) 25 MG tablet Take 1 tablet (25 mg total) by mouth daily after breakfast. 90 tablet 3  . ibuprofen (ADVIL,MOTRIN) 800 MG tablet Take 800 mg by mouth every 8 (eight) hours as needed.    . metoprolol succinate (TOPROL-XL) 25 MG 24 hr tablet Take 25 mg by mouth 2 (two) times daily.     . zolpidem (AMBIEN) 10 MG tablet Take 10 mg by mouth at bedtime.     No current facility-administered medications for this visit.     Review of Systems:  GENERAL:  Feels good.  Active.  No fevers or sweats.  Weight down 2 pounds. PERFORMANCE STATUS (ECOG):  1 HEENT:  No visual changes, runny nose, sore throat, mouth sores or tenderness. Lungs: Little shortness of breath.  Some wheezing.  No cough.  No hemoptysis. Cardiac:  No chest pain, palpitations, orthopnea, or PND. GI:  No nausea, vomiting, diarrhea, constipation, melena or hematochezia.  Colonoscopy in 2004. GU:  No urgency, frequency, dysuria, or hematuria. Musculoskeletal:  Back issues (see HPI).  Herniated disk.  No joint pain.  No muscle tenderness. Extremities:  Right foot cold and numb (old).  Arthritis in knees.  No swelling. Skin:  No rashes or skin changes. Neuro:  No  headache, numbness or weakness, balance or coordination issues. Endocrine:  Hot flashes.  No diabetes, thyroid issues, or night sweats. Psych:  Anxiety.  Panic attacks.  No mood changes or depression. Pain:  Chronic back pain secondary to herniated disk. Review of systems:  All other systems reviewed and found to be negative.  Physical Exam: There were no vitals taken for this visit. GENERAL:  Well developed, well nourished, woman sitting comfortably in the exam room in no acute distress. MENTAL STATUS:  Alert and oriented to person, place and time. HEAD:  Short brown hair.  Normocephalic, atraumatic, face symmetric, no Cushingoid features. EYES:  Brown eyes.  Pupils equal round and reactive to light and accomodation.  No conjunctivitis or scleral icterus. ENT:  Oropharynx clear without lesion.  Tongue normal. Mucous membranes moist.  RESPIRATORY:  Clear to auscultation without rales, wheezes or rhonchi. CARDIOVASCULAR:  Regular rate and rhythm without murmur, rub or gallop. BREAST:  s/p bilateral mastectomies with implants and reconstruction.  No masses, skin changes or nodularity.  ABDOMEN:  Soft, non-tender, with active bowel sounds, and no appreciable   hepatosplenomegaly.  No masses. SKIN:  No rashes, ulcers or lesions. EXTREMITIES: No edema, no skin discoloration or tenderness.  No palpable cords. LYMPH NODES: No palpable cervical, supraclavicular, axillary or inguinal adenopathy  NEUROLOGICAL: Unremarkable. PSYCH:  Appropriate.   No visits with results within 3 Day(s) from this visit.  Latest known visit with results is:  Hospital Outpatient Visit on 12/23/2016  Component Date Value Ref Range Status  . Glucose-Capillary 12/23/2016 90  65 - 99 mg/dL Final    Assessment:  Patrisia Faeth is a 66 y.o. female stage IA right breast cancer status post bilateral nipple sparing mastectomies and right axillary sentinel lymph node biopsy on 04/11/2013.    Initial biopsy on 02/06/2013 revealed  at least 5 mm invasive ductal carcinoma, basaloid subtype.  Pathology post mastectomy revealed no invasive carcinoma in the right breast.  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  Margins were negative.  Two right sided sentinel lymph nodes were negative.  Left breast pathology revealed 2 mm DCIS without invasive carcinoma.  Tumor was ER positive (1+), PR positive (1+), and Her2/neu negative.  She underwent immediate reconstruction with expanders followed by additional reconstructive surgery in 09/03/2013 and 12/2013.  Her mother had breast cancer in her 90s.  BRCA1/2 testing was negative on 02/16/2014.  She initially received Arimidex following recovery from surgery.  She describes "a lot of joint pain".  After approximately 1 year on Arimidex, she was switched to Aromasin for approximately 6 months.  Aromasin was discontinued on 05/08/2015 given the small size and 1+ hormone receptor positivity.  She denies any symptoms while on Aromasin.  She has been on Aromasin since 11/19/2015 and tolerating well.  CA27.29 was 56 on 12/30/2014, 64.3 on 11/19/2015, and 69.9 on 11/17/2016.  CA15-3 was 30 (normal) on 12/30/2014.  Bone density study on 10/08/2013 was normal with a T score of 1.4 in the lumbar spine, -0.1 in the left hip, and 0.1 in the right hip.  She has a history of pulmonary nodules. Chest CT scans on 10/27/2013 and 08/24/2014 noted scattered sub-79m nodules.  PET scan in 10/29/2014 revealed no evidence of metastatic disease.  The sub-533mnodules were below the resolution limits of the PET scan.  Chest, abdomen, and pelvic CT scan on 12/19/2015 revealed 7 indeterminate scattered pulmonary nodules in both lungs (largest 7 mm in the RUL)  Each of the 7 pulmonary nodules described on the original 12/19/2015 CT report were stable since 08/24/2014, indicating 15 month stability. These nodules are benign.   Bone scan on 12/19/2015 revealed no abnormal uptake in the lumbar spine.   There was mildly increased uptake in the posterior elements of approximately C3 or C4 on the right and in the body of C7 or T1.   Bone density study on 02/10/2016 revealed osteopenia with a T score of -1.5 in the right femoral neck.  Symptomatically, she has mild shortness of breath and intermittent wheezing.  She is tolerating Aromasin.  She has issues with a herniated disk.  Exam is unremarkable.  Plan: 1.  Labs today:  CBC with diff, CMP, CA27.29. 2.  Review chest, abdomen, and pelvic CT scan.  Pulmonary nodules are stable and likely benign.  Bone scan reveals mild uptake in the cervical spine.  Discuss cervical spine series. 3.  Review bone density study.  Discuss osteopenia.  Discuss calcium and vitamin D.   Discuss exercise.  Discuss a bisphosphonate if bone density decreases. 4.  Continue Aromasin.   5.  Cervical spine series 6.  RTC in 6 months for MD assessment and labs (CBC with diff, CMP, CA27.29).   Melissa C Corcoran, MD  01/05/2017, 5:42 AM  

## 2017-05-17 NOTE — Progress Notes (Signed)
[facility] day:  05/18/17   Chief Complaint: Brittany Ramos is a 66 y.o. female with stage IA right breast cancer who is seen for 6 month assessment.  HPI:   The patient was last seen in the medical oncology clinic on 11/17/2016.  At that time, patient was bring treated with a course of antibiotics for bronchitis. Patient also reporting lower back pain secondary to degenerative changes in her spine. She had some swelling in her right lower extremity at that time.   Patient with an elevated CA27.29 in 12/2014 that we have been following. CA 27.29 has continued to elevate since patient established care in this clinic; last result was 69.9 on 11/17/2016. Due to continued elevation in this tumor marker, we proceeded with a PET scan. Patient continues on Aromasin therapy.   PET scan on 12/23/2016 revealed no metabolic evidence of metastatic disease, with continued stability of scattered pulmonary nodules, all stable back to 08/24/2014; considered benign. There was patchy subpleural reticulation at the lung bases, not well evaluated on these low-dose CT images, grossly stable. Additional findings include aortic atherosclerosis, mild colonic diverticulosis and moderate fat containing periumbilical hernia.   Symptomatically, patient reports that she has been doing "good". Patient does report intermitted difficulties with her sleep. Patient has no physical complaints of anything in the clinic today.    Past Medical History:  Diagnosis Date  . Anxiety   . Arthritis   . Breast cancer, right ([place]) 2014   Bilateral Mastectomies, chemo pill.   . Claustrophobia 12/23/2016   Must take medication to get through a scan.  . H/O bilateral mastectomy 12/23/2016  . Hypertension     Past Surgical History:  Procedure Laterality Date  . AUGMENTATION MAMMAPLASTY Bilateral   . BILATERAL TOTAL MASTECTOMY WITH AXILLARY LYMPH NODE DISSECTION  2014  . COLONOSCOPY  2004   . MASTECTOMY Bilateral 2014   bil mast/recon    Family History  Problem Relation Age of Onset  . Cancer Mother        breast cancer; lung cancer    Social History:  has an unknown smoking status. She has never used smokeless tobacco. She reports that she does not drink alcohol or use drugs.  She is originally from [place].  She lived in [place], [place], before moving to [place].  She lives in [place].  The patient is alone today.  Allergies: No Known Allergies  Current Medications: Current Outpatient Prescriptions  Medication Sig Dispense Refill  . aspirin EC 81 MG tablet Take 81 mg by mouth daily.    . Calcium Carbonate (CALCIUM 600 PO) Take 1 tablet by mouth daily.    . Cholecalciferol (VITAMIN D PO) Take 1 tablet by mouth daily.    Marland Kitchen exemestane (AROMASIN) 25 MG tablet Take 1 tablet (25 mg total) by mouth daily after breakfast. 90 tablet 3  . ibuprofen (ADVIL,MOTRIN) 800 MG tablet Take 800 mg by mouth every 8 (eight) hours as needed.    . ALPRAZolam (XANAX) 0.25 MG tablet Take 1 tab prior to scan (Patient not taking: Reported on 05/18/2017) 1 tablet 0  . cyclobenzaprine (FLEXERIL) 10 MG tablet Take 10 mg by mouth 3 (three) times daily as needed for muscle spasms.    Marland Kitchen gabapentin (NEURONTIN) 100 MG capsule Take 100 mg by mouth 3 (three) times daily.    . metoprolol succinate (TOPROL-XL) 25 MG 24 hr tablet Take 25 mg by mouth 2 (two) times daily.     Marland Kitchen  zolpidem (AMBIEN) 10 MG tablet Take 10 mg by mouth at bedtime.     No current facility-administered medications for this visit.     Review of Systems:  GENERAL:  Feels good.   No fevers or sweats.  Weight stable. PERFORMANCE STATUS (ECOG):  1 HEENT:  No visual changes, runny nose, sore throat, mouth sores or tenderness. Lungs: No shortness of breath.  No cough.  No hemoptysis. Cardiac:  No chest pain, palpitations, orthopnea, or PND. GI:  No nausea, vomiting, diarrhea, constipation, melena or hematochezia.  Colonoscopy in  2004. GU:  No urgency, frequency, dysuria, or hematuria. Musculoskeletal:  Back issues (see HPI).  Herniated disk.  No joint pain.  No muscle tenderness. Extremities: Arthritis in knees.  No swelling. Skin:  No rashes or skin changes. Neuro:  No headache, numbness or weakness, balance or coordination issues. Endocrine:  Hot flashes.  No diabetes, thyroid issues, or night sweats. Psych:  Some insomnia. Anxiety.  Panic attacks.  No mood changes or depression. Pain:  Chronic back pain secondary to herniated disk. Review of systems:  All other systems reviewed and found to be negative.  Physical Exam: Blood pressure 130/78, pulse 87, temperature 98.2 F (36.8 C), temperature source Tympanic, resp. rate 18, weight 228 lb 2.8 oz (103.5 kg). GENERAL:  Well developed, well nourished, woman sitting comfortably in the exam room in no acute distress. MENTAL STATUS:  Alert and oriented to person, place and time. HEAD:  Short brown hair.  Normocephalic, atraumatic, face symmetric, no Cushingoid features. EYES:  Glasses.  Brown eyes.  Pupils equal round and reactive to light and accomodation.  No conjunctivitis or scleral icterus. ENT:  Oropharynx clear without lesion.  Tongue normal. Mucous membranes moist.  RESPIRATORY:  Clear to auscultation without rales, wheezes or rhonchi. CARDIOVASCULAR:  Regular rate and rhythm without murmur, rub or gallop. BREAST:  s/p bilateral mastectomies with implants and reconstruction.  No masses, skin changes or nodularity.  ABDOMEN:  Soft, non-tender, with active bowel sounds, and no appreciable hepatosplenomegaly.  No masses. SKIN:  No rashes, ulcers or lesions. EXTREMITIES: No edema, no skin discoloration or tenderness.  No palpable cords. LYMPH NODES: No palpable cervical, supraclavicular, axillary or inguinal adenopathy  NEUROLOGICAL: Unremarkable. PSYCH:  Appropriate.   Appointment on 05/18/2017  Component Date Value Ref Range Status  . Sodium 05/18/2017 138   135 - 145 mmol/L Final  . Potassium 05/18/2017 4.2  3.5 - 5.1 mmol/L Final  . Chloride 05/18/2017 104  101 - 111 mmol/L Final  . CO2 05/18/2017 26  22 - 32 mmol/L Final  . Glucose, Bld 05/18/2017 124* 65 - 99 mg/dL Final  . BUN 05/18/2017 19  6 - 20 mg/dL Final  . Creatinine, Ser 05/18/2017 0.90  0.44 - 1.00 mg/dL Final  . Calcium 05/18/2017 9.4  8.9 - 10.3 mg/dL Final  . Total Protein 05/18/2017 7.7  6.5 - 8.1 g/dL Final  . Albumin 05/18/2017 4.3  3.5 - 5.0 g/dL Final  . AST 05/18/2017 24  15 - 41 U/L Final  . ALT 05/18/2017 21  14 - 54 U/L Final  . Alkaline Phosphatase 05/18/2017 108  38 - 126 U/L Final  . Total Bilirubin 05/18/2017 0.8  0.3 - 1.2 mg/dL Final  . GFR calc non Af Amer 05/18/2017 >60  >60 mL/min Final  . GFR calc Af Amer 05/18/2017 >60  >60 mL/min Final   Comment: (NOTE) The eGFR has been calculated using the CKD EPI equation. This calculation has not been  validated in all clinical situations. eGFR's persistently <60 mL/min signify possible Chronic Kidney Disease.   . Anion gap 05/18/2017 8  5 - 15 Final  . WBC 05/18/2017 7.0  3.6 - 11.0 K/uL Final  . RBC 05/18/2017 4.74  3.80 - 5.20 MIL/uL Final  . Hemoglobin 05/18/2017 14.6  12.0 - 16.0 g/dL Final  . HCT 05/18/2017 43.9  35.0 - 47.0 % Final  . MCV 05/18/2017 92.6  80.0 - 100.0 fL Final  . MCH 05/18/2017 30.8  26.0 - 34.0 pg Final  . MCHC 05/18/2017 33.2  32.0 - 36.0 g/dL Final  . RDW 05/18/2017 13.4  11.5 - 14.5 % Final  . Platelets 05/18/2017 260  150 - 440 K/uL Final  . Neutrophils Relative % 05/18/2017 58  % Final  . Neutro Abs 05/18/2017 4.1  1.4 - 6.5 K/uL Final  . Lymphocytes Relative 05/18/2017 31  % Final  . Lymphs Abs 05/18/2017 2.2  1.0 - 3.6 K/uL Final  . Monocytes Relative 05/18/2017 5  % Final  . Monocytes Absolute 05/18/2017 0.3  0.2 - 0.9 K/uL Final  . Eosinophils Relative 05/18/2017 5  % Final  . Eosinophils Absolute 05/18/2017 0.3  0 - 0.7 K/uL Final  . Basophils Relative 05/18/2017 1  %  Final  . Basophils Absolute 05/18/2017 0.1  0 - 0.1 K/uL Final    Assessment:  Brittany Ramos is a 66 y.o. female stage IA right breast cancer status post bilateral nipple sparing mastectomies and right axillary sentinel lymph node biopsy on 04/11/2013.    Initial biopsy on 02/06/2013 revealed at least 5 mm invasive ductal carcinoma, basaloid subtype.  Pathology post mastectomy revealed no invasive carcinoma in the right breast.  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  Margins were negative.  Two right sided sentinel lymph nodes were negative.  Left breast pathology revealed 2 mm DCIS without invasive carcinoma.  Tumor was ER positive (1+), PR positive (1+), and Her2/neu negative.  She underwent immediate reconstruction with expanders followed by additional reconstructive surgery in 09/03/2013 and 12/2013.  Her mother had breast cancer in her 38s.  BRCA1/2 testing was negative on 02/16/2014.  She initially received Arimidex following recovery from surgery.  She describes "a lot of joint pain".  After approximately 1 year on Arimidex, she was switched to Aromasin for approximately 6 months.  Aromasin was discontinued on 05/08/2015 given the small size and 1+ hormone receptor positivity.  She denies any symptoms while on Aromasin.  She has been on Aromasin since 11/19/2015 and tolerating well.  CA27.29 has been followed: 56 on 12/30/2014, 64.3 on 11/19/2015, 69.3 on 05/19/2016, and 69.9 on 11/17/2016.  CA15-3 was 30 (normal) on 12/30/2014.  Bone density study on 10/08/2013 was normal with a T score of 1.4 in the lumbar spine, -0.1 in the left hip, and 0.1 in the right hip.  She has a history of pulmonary nodules. Chest CT scans on 10/27/2013 and 08/24/2014 noted scattered sub-28m nodules.  PET scans in 10/29/2014 and 12/23/2016 revealed no evidence of metastatic disease.  The sub-576mnodules were below the resolution limits of the PET scan.  Chest, abdomen, and pelvic CT on  12/19/2015 revealed 7 indeterminate scattered pulmonary nodules in both lungs (largest 7 mm in the RUL)  Each of the 7 pulmonary nodules described on the original 12/19/2015 CT report were stable since 08/24/2014, indicating 15 month stability. These nodules are benign.   Bone scan on 12/19/2015 revealed no abnormal uptake in  the lumbar spine.  There was mildly increased uptake in the posterior elements of approximately C3 or C4 on the right and in the body of C7 or T1.   PET scan on 12/23/2016 revealed no metabolic evidence of metastatic disease, with continued stability of scattered pulmonary nodules, all stable back to 08/24/2014; considered benign. There was patchy subpleural reticulation at the lung bases, not well evaluated on these low-dose CT images, grossly stable.  Bone density study on 02/10/2016 revealed osteopenia with a T score of -1.5 in the right femoral neck.  Symptomatically,she is doing well. She denies any physical complaints. Exam is unremarkable. CBC and CMP normal.  Plan: 1.  Labs today:  CBC with diff, CMP, CA27.29. 2.  Review interval PET scan- no evidence of metastatic disease.  Discuss continued observation as patient asymptomatic. Repeat imaging studies if CA27.29 increases by 10-15%. 3.  Discussed BCI testing.  Pathology in [place].  Information provided for patient review. Will readdress during her next visit. 4.  Continue Aromasin. 5.  Continue calcium and vitamin D.  6.  Anticipate next bone density study on 02/09/2018. 7.  Re: insomnia - patient has tried melatonin in the past. Discussed sleep hygiene and non pharmacological interventions to help with sleep. Suspected altered sleep wake cycle, as she is falling asleep during the day. Patient to discuss sleep issues with PCP.  8.  Re: breast implants - patient does not have a Therapist, music. Asking if implants need to be changed; they have been in place 12/2013 in [place]. RN to communicate with breast  reconstruction surgeons to determine whether or not patient will require follow up, and if so, patient will need to be referred.  Copy of implant card obtained. 9.  RTC in 6 months for MD assessment and labs (CBC with diff, CMP, CA27.29).   Overton Mam, NP 05/18/2017, 9:58 AM   I saw and evaluated the patient, participating in the key portions of the service and reviewing pertinent diagnostic studies and records.  I reviewed the nurse practitioner's note and agree with the findings and the plan.  The assessment and plan were discussed with the patient.  Multiple questions were asked by the patient and answered.   Lequita Asal, MD 05/18/2017,2:01 PM

## 2017-05-18 ENCOUNTER — Encounter: Payer: Self-pay | Admitting: Hematology and Oncology

## 2017-05-18 ENCOUNTER — Other Ambulatory Visit: Payer: Self-pay | Admitting: Hematology and Oncology

## 2017-05-18 ENCOUNTER — Inpatient Hospital Stay: Payer: Managed Care, Other (non HMO)

## 2017-05-18 ENCOUNTER — Inpatient Hospital Stay: Payer: Managed Care, Other (non HMO) | Attending: Hematology and Oncology | Admitting: Hematology and Oncology

## 2017-05-18 VITALS — BP 130/78 | HR 87 | Temp 98.2°F | Resp 18 | Wt 228.2 lb

## 2017-05-18 DIAGNOSIS — R918 Other nonspecific abnormal finding of lung field: Secondary | ICD-10-CM | POA: Diagnosis not present

## 2017-05-18 DIAGNOSIS — Z170 Estrogen receptor positive status [ER+]: Secondary | ICD-10-CM | POA: Diagnosis not present

## 2017-05-18 DIAGNOSIS — I700 Atherosclerosis of aorta: Secondary | ICD-10-CM | POA: Diagnosis not present

## 2017-05-18 DIAGNOSIS — M85861 Other specified disorders of bone density and structure, right lower leg: Secondary | ICD-10-CM

## 2017-05-18 DIAGNOSIS — Z7982 Long term (current) use of aspirin: Secondary | ICD-10-CM | POA: Insufficient documentation

## 2017-05-18 DIAGNOSIS — Z803 Family history of malignant neoplasm of breast: Secondary | ICD-10-CM | POA: Diagnosis not present

## 2017-05-18 DIAGNOSIS — D0512 Intraductal carcinoma in situ of left breast: Secondary | ICD-10-CM | POA: Diagnosis not present

## 2017-05-18 DIAGNOSIS — G4700 Insomnia, unspecified: Secondary | ICD-10-CM | POA: Diagnosis not present

## 2017-05-18 DIAGNOSIS — C50911 Malignant neoplasm of unspecified site of right female breast: Secondary | ICD-10-CM

## 2017-05-18 DIAGNOSIS — D0511 Intraductal carcinoma in situ of right breast: Secondary | ICD-10-CM | POA: Diagnosis not present

## 2017-05-18 DIAGNOSIS — M8580 Other specified disorders of bone density and structure, unspecified site: Secondary | ICD-10-CM | POA: Diagnosis not present

## 2017-05-18 DIAGNOSIS — Z79811 Long term (current) use of aromatase inhibitors: Secondary | ICD-10-CM

## 2017-05-18 DIAGNOSIS — F419 Anxiety disorder, unspecified: Secondary | ICD-10-CM | POA: Diagnosis not present

## 2017-05-18 DIAGNOSIS — Z9013 Acquired absence of bilateral breasts and nipples: Secondary | ICD-10-CM | POA: Insufficient documentation

## 2017-05-18 DIAGNOSIS — I10 Essential (primary) hypertension: Secondary | ICD-10-CM | POA: Insufficient documentation

## 2017-05-18 LAB — CBC WITH DIFFERENTIAL/PLATELET
Basophils Absolute: 0.1 10*3/uL (ref 0–0.1)
Basophils Relative: 1 %
Eosinophils Absolute: 0.3 10*3/uL (ref 0–0.7)
Eosinophils Relative: 5 %
HCT: 43.9 % (ref 35.0–47.0)
Hemoglobin: 14.6 g/dL (ref 12.0–16.0)
Lymphocytes Relative: 31 %
Lymphs Abs: 2.2 10*3/uL (ref 1.0–3.6)
MCH: 30.8 pg (ref 26.0–34.0)
MCHC: 33.2 g/dL (ref 32.0–36.0)
MCV: 92.6 fL (ref 80.0–100.0)
Monocytes Absolute: 0.3 10*3/uL (ref 0.2–0.9)
Monocytes Relative: 5 %
Neutro Abs: 4.1 10*3/uL (ref 1.4–6.5)
Neutrophils Relative %: 58 %
Platelets: 260 10*3/uL (ref 150–440)
RBC: 4.74 MIL/uL (ref 3.80–5.20)
RDW: 13.4 % (ref 11.5–14.5)
WBC: 7 10*3/uL (ref 3.6–11.0)

## 2017-05-18 LAB — COMPREHENSIVE METABOLIC PANEL
ALT: 21 U/L (ref 14–54)
AST: 24 U/L (ref 15–41)
Albumin: 4.3 g/dL (ref 3.5–5.0)
Alkaline Phosphatase: 108 U/L (ref 38–126)
Anion gap: 8 (ref 5–15)
BUN: 19 mg/dL (ref 6–20)
CO2: 26 mmol/L (ref 22–32)
Calcium: 9.4 mg/dL (ref 8.9–10.3)
Chloride: 104 mmol/L (ref 101–111)
Creatinine, Ser: 0.9 mg/dL (ref 0.44–1.00)
GFR calc Af Amer: >60 mL/min (ref >60)
GFR calc non Af Amer: >60 mL/min (ref >60)
Glucose, Bld: 124 mg/dL — ABNORMAL HIGH (ref 65–99)
Potassium: 4.2 mmol/L (ref 3.5–5.1)
Sodium: 138 mmol/L (ref 135–145)
Total Bilirubin: 0.8 mg/dL (ref 0.3–1.2)
Total Protein: 7.7 g/dL (ref 6.5–8.1)

## 2017-05-18 NOTE — Progress Notes (Signed)
Patient offers no complaints today other than not being able to sleep at night.

## 2017-05-19 ENCOUNTER — Telehealth: Payer: Self-pay | Admitting: *Deleted

## 2017-05-19 ENCOUNTER — Other Ambulatory Visit: Payer: Self-pay | Admitting: *Deleted

## 2017-05-19 DIAGNOSIS — Z853 Personal history of malignant neoplasm of breast: Secondary | ICD-10-CM

## 2017-05-19 LAB — CANCER ANTIGEN 27.29: CA 27.29: 81.6 U/mL — ABNORMAL HIGH (ref 0.0–38.6)

## 2017-05-19 NOTE — Telephone Encounter (Signed)
Erroneous error

## 2017-05-19 NOTE — Telephone Encounter (Signed)
-----   Message from Lequita Asal, MD sent at 05/19/2017  7:21 AM EDT ----- Regarding: Please call patient  Tumor marker up again.  Ensure no symptoms.  Recheck in 1 month.  If continues to rise will need repeat imaging.  M  ----- Message ----- From: Interface, Lab In [place] Sent: 05/18/2017   9:21 AM To: Lequita Asal, MD

## 2017-05-19 NOTE — Telephone Encounter (Signed)
-----   Message from Lequita Asal, MD sent at 05/19/2017  7:21 AM EDT ----- Regarding: Please call patient  Tumor marker up again.  Ensure no symptoms.  Recheck in 1 month.  If continues to rise will need repeat imaging.  M  ----- Message ----- From: Interface, Lab In [place] Sent: 05/18/2017   9:21 AM To: Lequita Asal, MD

## 2017-05-19 NOTE — Telephone Encounter (Signed)
Patient called back.  Informed her that Ca 27.29 is elevated again.  MD recommends rechecking in one month.  Patient states she has no symptoms other than back pain, however that is not new.

## 2017-05-19 NOTE — Telephone Encounter (Signed)
Attempted to call patient.  LVM for patient to call back.

## 2017-06-15 ENCOUNTER — Inpatient Hospital Stay: Payer: Managed Care, Other (non HMO) | Attending: Hematology and Oncology

## 2017-06-15 DIAGNOSIS — D0512 Intraductal carcinoma in situ of left breast: Secondary | ICD-10-CM | POA: Diagnosis not present

## 2017-06-15 DIAGNOSIS — Z170 Estrogen receptor positive status [ER+]: Secondary | ICD-10-CM | POA: Insufficient documentation

## 2017-06-15 DIAGNOSIS — Z79811 Long term (current) use of aromatase inhibitors: Secondary | ICD-10-CM | POA: Insufficient documentation

## 2017-06-15 DIAGNOSIS — M8580 Other specified disorders of bone density and structure, unspecified site: Secondary | ICD-10-CM | POA: Diagnosis not present

## 2017-06-15 DIAGNOSIS — Z9013 Acquired absence of bilateral breasts and nipples: Secondary | ICD-10-CM | POA: Diagnosis not present

## 2017-06-15 DIAGNOSIS — G4700 Insomnia, unspecified: Secondary | ICD-10-CM | POA: Diagnosis not present

## 2017-06-15 DIAGNOSIS — R918 Other nonspecific abnormal finding of lung field: Secondary | ICD-10-CM | POA: Diagnosis not present

## 2017-06-15 DIAGNOSIS — Z803 Family history of malignant neoplasm of breast: Secondary | ICD-10-CM | POA: Insufficient documentation

## 2017-06-15 DIAGNOSIS — Z853 Personal history of malignant neoplasm of breast: Secondary | ICD-10-CM

## 2017-06-15 DIAGNOSIS — I700 Atherosclerosis of aorta: Secondary | ICD-10-CM | POA: Insufficient documentation

## 2017-06-15 DIAGNOSIS — D0511 Intraductal carcinoma in situ of right breast: Secondary | ICD-10-CM | POA: Diagnosis present

## 2017-06-15 DIAGNOSIS — F419 Anxiety disorder, unspecified: Secondary | ICD-10-CM | POA: Insufficient documentation

## 2017-06-15 DIAGNOSIS — Z7982 Long term (current) use of aspirin: Secondary | ICD-10-CM | POA: Diagnosis not present

## 2017-06-15 DIAGNOSIS — I10 Essential (primary) hypertension: Secondary | ICD-10-CM | POA: Diagnosis not present

## 2017-06-16 LAB — CA 27.29 (SERIAL MONITOR): CA 27.29: 81.4 U/mL — ABNORMAL HIGH (ref 0.0–38.6)

## 2017-06-17 ENCOUNTER — Telehealth: Payer: Self-pay | Admitting: *Deleted

## 2017-06-17 NOTE — Telephone Encounter (Signed)
-----   Message from Lequita Asal, MD sent at 06/16/2017 12:07 PM EDT ----- Regarding: Please call patient  CA27.29 remains elevated.     16.7% higher than when last scans performed.  Any symptoms?    We can repeat scans or continue to watch closely.  M   ----- Message ----- From: Interface, Lab In [place] Sent: 06/16/2017  11:43 AM To: Lequita Asal, MD

## 2017-06-17 NOTE — Telephone Encounter (Signed)
Called patient to inform her that her tumor marker is continuing to stay elevated.  MD wants to know if patient is having any symptoms.  She denies any symptoms.  Patient was asked if she would like to have another scan or just monitor this.  Patient states she will do what ever the MD recommends.

## 2017-06-19 NOTE — Telephone Encounter (Signed)
  I would check again in 1 month.  If still elevated, scan at that time.  M

## 2017-06-20 ENCOUNTER — Other Ambulatory Visit: Payer: Self-pay | Admitting: *Deleted

## 2017-06-20 DIAGNOSIS — Z853 Personal history of malignant neoplasm of breast: Secondary | ICD-10-CM

## 2017-07-20 ENCOUNTER — Inpatient Hospital Stay: Payer: Managed Care, Other (non HMO) | Attending: Hematology and Oncology

## 2017-11-15 ENCOUNTER — Other Ambulatory Visit: Payer: Managed Care, Other (non HMO)

## 2017-11-16 ENCOUNTER — Inpatient Hospital Stay: Payer: Self-pay | Admitting: Hematology and Oncology

## 2017-11-16 ENCOUNTER — Other Ambulatory Visit: Payer: Managed Care, Other (non HMO)

## 2017-11-16 ENCOUNTER — Inpatient Hospital Stay: Payer: Self-pay

## 2017-11-16 NOTE — Progress Notes (Deleted)
[facility] day:  11/16/17   Chief Complaint: Brittany Ramos is a 67 y.o. female with stage IA right breast cancer who is seen for 6 month assessment.  HPI:   The patient was last seen in the medical oncology clinic on 05/18/2017.  At that time, she was doing well. She denied any physical complaints. Exam was unremarkable. CBC and CMP were normal.  CA27.29 was 81.6.  Repeat testing on 06/15/2017 was 81.4.  She was seen by Dr Hazle Nordmann at the [facility] on 06/28/2017.  Restaging CT and bone scans were ordered.  Elevated CA27.29 was felt possibly secondary to fatty liver.  Chest, abdomen, and pelvic CT at [facility] on 07/11/2017 revealed small bilateral pulmonary nodules, which are new from 2015. An outside CT report dated 03/09/16 mentions multiple bilateral pulmonary nodules. If this becomes available, comparison could be made to determine stability. There was no evidence of metastatic disease to the abdomen or pelvis.  Bone scan on 07/11/2017 revealed no suspicious foci of increased radiotracer activity to suggest osseous metastatic disease. There was blateral, symmetric radiotracer activity in the acromioclavicular joints, knees, ankles consistent with degenerative changes.  There was physiologic tracer activity is identified in the soft tissues,kidneys and urinary bladder.  During the interim,   Past Medical History:  Diagnosis Date  . Anxiety   . Arthritis   . Breast cancer, right ([place]) 2014   Bilateral Mastectomies, chemo pill.   . Claustrophobia 12/23/2016   Must take medication to get through a scan.  . H/O bilateral mastectomy 12/23/2016  . Hypertension     Past Surgical History:  Procedure Laterality Date  . AUGMENTATION MAMMAPLASTY Bilateral   . BILATERAL TOTAL MASTECTOMY WITH AXILLARY LYMPH NODE DISSECTION  2014  . COLONOSCOPY  2004  . MASTECTOMY Bilateral 2014   bil mast/recon    Family History  Problem Relation  Age of Onset  . Cancer Mother        breast cancer; lung cancer    Social History:  has an unknown smoking status. she has never used smokeless tobacco. She reports that she does not drink alcohol or use drugs.  She is originally from [place].  She lived in [place], [place], before moving to [place].  She lives in [place].  The patient is alone today.  Allergies: No Known Allergies  Current Medications: Current Outpatient Medications  Medication Sig Dispense Refill  . ALPRAZolam (XANAX) 0.25 MG tablet Take 1 tab prior to scan (Patient not taking: Reported on 05/18/2017) 1 tablet 0  . aspirin EC 81 MG tablet Take 81 mg by mouth daily.    . Calcium Carbonate (CALCIUM 600 PO) Take 1 tablet by mouth daily.    . Cholecalciferol (VITAMIN D PO) Take 1 tablet by mouth daily.    . cyclobenzaprine (FLEXERIL) 10 MG tablet Take 10 mg by mouth 3 (three) times daily as needed for muscle spasms.    Marland Kitchen exemestane (AROMASIN) 25 MG tablet TAKE 1 TABLET BY MOUTH DAILY AFTER BREAKFAST. 90 tablet 2  . gabapentin (NEURONTIN) 100 MG capsule Take 100 mg by mouth 3 (three) times daily.    Marland Kitchen ibuprofen (ADVIL,MOTRIN) 800 MG tablet Take 800 mg by mouth every 8 (eight) hours as needed.    . metoprolol succinate (TOPROL-XL) 25 MG 24 hr tablet Take 25 mg by mouth 2 (two) times daily.     Marland Kitchen zolpidem (AMBIEN) 10 MG tablet Take 10 mg by mouth at  bedtime.     No current facility-administered medications for this visit.     Review of Systems:  GENERAL:  Feels good.   No fevers or sweats.  Weight stable. PERFORMANCE STATUS (ECOG):  1 HEENT:  No visual changes, runny nose, sore throat, mouth sores or tenderness. Lungs: No shortness of breath.  No cough.  No hemoptysis. Cardiac:  No chest pain, palpitations, orthopnea, or PND. GI:  No nausea, vomiting, diarrhea, constipation, melena or hematochezia.  Colonoscopy in 2004. GU:  No urgency, frequency, dysuria, or hematuria. Musculoskeletal:  Back issues (see HPI).   Herniated disk.  No joint pain.  No muscle tenderness. Extremities: Arthritis in knees.  No swelling. Skin:  No rashes or skin changes. Neuro:  No headache, numbness or weakness, balance or coordination issues. Endocrine:  Hot flashes.  No diabetes, thyroid issues, or night sweats. Psych:  Some insomnia. Anxiety.  Panic attacks.  No mood changes or depression. Pain:  Chronic back pain secondary to herniated disk. Review of systems:  All other systems reviewed and found to be negative.  Physical Exam: There were no vitals taken for this visit. GENERAL:  Well developed, well nourished, woman sitting comfortably in the exam room in no acute distress. MENTAL STATUS:  Alert and oriented to person, place and time. HEAD:  Short brown hair.  Normocephalic, atraumatic, face symmetric, no Cushingoid features. EYES:  Glasses.  Brown eyes.  Pupils equal round and reactive to light and accomodation.  No conjunctivitis or scleral icterus. ENT:  Oropharynx clear without lesion.  Tongue normal. Mucous membranes moist.  RESPIRATORY:  Clear to auscultation without rales, wheezes or rhonchi. CARDIOVASCULAR:  Regular rate and rhythm without murmur, rub or gallop. BREAST:  s/p bilateral mastectomies with implants and reconstruction.  No masses, skin changes or nodularity.  ABDOMEN:  Soft, non-tender, with active bowel sounds, and no appreciable hepatosplenomegaly.  No masses. SKIN:  No rashes, ulcers or lesions. EXTREMITIES: No edema, no skin discoloration or tenderness.  No palpable cords. LYMPH NODES: No palpable cervical, supraclavicular, axillary or inguinal adenopathy  NEUROLOGICAL: Unremarkable. PSYCH:  Appropriate.   No visits with results within 3 Day(s) from this visit.  Latest known visit with results is:  Appointment on 06/15/2017  Component Date Value Ref Range Status  . CA 27.29 06/15/2017 81.4* 0.0 - 38.6 U/mL Final   Comment: (NOTE) Bayer Cent[facility]/ACS methodology Performed At: [facility] [address] [place], [place] [id number] Lindon Romp MD IR:[id number]     Assessment:  Brittany Ramos is a 67 y.o. female stage IA right breast cancer status post bilateral nipple sparing mastectomies and right axillary sentinel lymph node biopsy on 04/11/2013.    Initial biopsy on 02/06/2013 revealed at least 5 mm invasive ductal carcinoma, basaloid subtype.  Pathology post mastectomy revealed no invasive carcinoma in the right breast.  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  Margins were negative.  Two right sided sentinel lymph nodes were negative.  Left breast pathology revealed 2 mm DCIS without invasive carcinoma.  Tumor was ER positive (1+), PR positive (1+), and Her2/neu negative.  She underwent immediate reconstruction with expanders followed by additional reconstructive surgery in 09/03/2013 and 12/2013.  Her mother had breast cancer in her 73s.  BRCA1/2 testing was negative on 02/16/2014.  She initially received Arimidex following recovery from surgery.  She describes "a lot of joint pain".  After approximately 1 year on Arimidex, she was switched to Aromasin for approximately 6 months.  Aromasin was discontinued on 05/08/2015 given the small size and 1+ hormone receptor positivity.  She denies any symptoms while on Aromasin.  She has been on Aromasin since 11/19/2015 and tolerating well.  CA27.29 has been followed: 56 on 12/30/2014, 64.3 on 11/19/2015, 69.3 on 05/19/2016, 69.9 on 11/17/2016, 81.6 on 05/18/2017, and 81.4 on 06/15/2017.  CA15-3 was 30 (normal) on 12/30/2014.  Bone density study on 10/08/2013 was normal with a T score of 1.4 in the lumbar spine, -0.1 in the left hip, and 0.1 in the right hip.  Bone density study on 02/10/2016 revealed osteopenia with a T-score of -1.5 in the right femoral neck.  She has a history of pulmonary nodules. Chest CT scans on 10/27/2013 and 08/24/2014 noted scattered sub-98m nodules.  PET scans in 10/29/2014 and  12/23/2016 revealed no evidence of metastatic disease.  The sub-576mnodules were below the resolution limits of the PET scan.  Chest, abdomen, and pelvic CT on 12/19/2015 revealed 7 indeterminate scattered pulmonary nodules in both lungs (largest 7 mm in the RUL)  Each of the 7 pulmonary nodules described on the original 12/19/2015 CT report were stable since 08/24/2014, indicating 15 month stability. These nodules are benign.   Bone scan on 12/19/2015 revealed no abnormal uptake in the lumbar spine.  There was mildly increased uptake in the posterior elements of approximately C3 or C4 on the right and in the body of C7 or T1.   PET scan on 12/23/2016 revealed no metabolic evidence of metastatic disease, with continued stability of scattered pulmonary nodules, all stable back to 08/24/2014; considered benign. There was patchy subpleural reticulation at the lung bases, not well evaluated on these low-dose CT images, grossly stable.  Bone density study on 02/10/2016 revealed osteopenia with a T score of -1.5 in the right femoral neck.  Symptomatically,she is doing well. She denies any physical complaints. Exam is unremarkable. CBC and CMP normal.  Plan: 1.  Labs today:  CBC with diff, CMP, CA27.29.  2.  Review interval PET scan- no evidence of metastatic disease.  Discuss continued observation as patient asymptomatic. Repeat imaging studies if CA27.29 increases by 10-15%. 3.  Discussed BCI testing.  [place] Information provided for patient review. Will readdress during her next visit. 4.  Continue Aromasin. 5.  Continue calcium and vitamin D.  6.  Anticipate next bone density study on 02/09/2018. 7.  Re: insomnia - patient has tried melatonin in the past. Discussed sleep hygiene and non pharmacological interventions to help with sleep. Suspected altered sleep wake cycle, as she is falling asleep during the day. Patient to discuss sleep issues with PCP.  8.  Re: breast implants -  patient does not have a loTherapist, musicAsking if implants need to be changed; [place] surgeons to determine whether or not patient will require follow up, and if so, patient will need to be referred.  Copy of implant card obtained. 9.  RTC in 6 months for MD assessment and labs (CBC with diff, CMP, CA27.29).   BrOverton MamNP 11/16/2017, 5:34 AM   I saw and evaluated the patient, participating in the key portions of the service and reviewing pertinent diagnostic studies and records.  I reviewed the nurse practitioner's note and agree with the findings and the plan.  The assessment and plan were discussed with the patient.  Multiple questions were asked by the patient and answered.   Melissa  Elmarie Mainland, MD 11/16/2017,5:34 AM
